# Patient Record
Sex: Female | Born: 1940 | Race: White | Hispanic: No | Smoking: Former smoker
Health system: Southern US, Community
[De-identification: ages and names within clinical notes are randomized; demographics above are authoritative.]

## PROBLEM LIST (undated history)

## (undated) DIAGNOSIS — E785 Hyperlipidemia, unspecified: Secondary | ICD-10-CM

## (undated) DIAGNOSIS — F0390 Unspecified dementia without behavioral disturbance: Secondary | ICD-10-CM

## (undated) DIAGNOSIS — C439 Malignant melanoma of skin, unspecified: Secondary | ICD-10-CM

## (undated) DIAGNOSIS — D471 Chronic myeloproliferative disease: Secondary | ICD-10-CM

## (undated) DIAGNOSIS — E871 Hypo-osmolality and hyponatremia: Secondary | ICD-10-CM

## (undated) DIAGNOSIS — L309 Dermatitis, unspecified: Secondary | ICD-10-CM

## (undated) DIAGNOSIS — I10 Essential (primary) hypertension: Secondary | ICD-10-CM

## (undated) DIAGNOSIS — M8580 Other specified disorders of bone density and structure, unspecified site: Secondary | ICD-10-CM

## (undated) HISTORY — DX: Chronic myeloproliferative disease: D47.1

## (undated) HISTORY — DX: Essential (primary) hypertension: I10

## (undated) HISTORY — PX: OVARIAN CYST REMOVAL: SHX89

## (undated) HISTORY — DX: Hypo-osmolality and hyponatremia: E87.1

## (undated) HISTORY — DX: Dermatitis, unspecified: L30.9

## (undated) HISTORY — DX: Malignant melanoma of skin, unspecified: C43.9

## (undated) HISTORY — DX: Hyperlipidemia, unspecified: E78.5

## (undated) HISTORY — PX: COLONOSCOPY: SHX174

## (undated) HISTORY — DX: Unspecified dementia, unspecified severity, without behavioral disturbance, psychotic disturbance, mood disturbance, and anxiety: F03.90

## (undated) HISTORY — DX: Other specified disorders of bone density and structure, unspecified site: M85.80

---

## 1999-12-26 ENCOUNTER — Encounter: Payer: Self-pay | Admitting: Family Medicine

## 2001-01-08 ENCOUNTER — Other Ambulatory Visit: Admission: RE | Admit: 2001-01-08 | Discharge: 2001-01-08 | Payer: Self-pay | Admitting: Family Medicine

## 2001-01-15 ENCOUNTER — Encounter: Payer: Self-pay | Admitting: Family Medicine

## 2001-01-15 ENCOUNTER — Ambulatory Visit (HOSPITAL_COMMUNITY): Admission: RE | Admit: 2001-01-15 | Discharge: 2001-01-15 | Payer: Self-pay | Admitting: Family Medicine

## 2002-01-26 ENCOUNTER — Encounter: Payer: Self-pay | Admitting: Family Medicine

## 2002-01-26 ENCOUNTER — Ambulatory Visit (HOSPITAL_COMMUNITY): Admission: RE | Admit: 2002-01-26 | Discharge: 2002-01-26 | Payer: Self-pay | Admitting: Family Medicine

## 2002-05-03 ENCOUNTER — Other Ambulatory Visit: Admission: RE | Admit: 2002-05-03 | Discharge: 2002-05-03 | Payer: Self-pay | Admitting: Family Medicine

## 2002-05-12 ENCOUNTER — Emergency Department (HOSPITAL_COMMUNITY): Admission: EM | Admit: 2002-05-12 | Discharge: 2002-05-12 | Payer: Self-pay | Admitting: Emergency Medicine

## 2002-05-31 ENCOUNTER — Encounter: Payer: Self-pay | Admitting: Family Medicine

## 2003-01-31 ENCOUNTER — Ambulatory Visit (HOSPITAL_COMMUNITY): Admission: RE | Admit: 2003-01-31 | Discharge: 2003-01-31 | Payer: Self-pay | Admitting: Family Medicine

## 2003-01-31 ENCOUNTER — Encounter: Payer: Self-pay | Admitting: Family Medicine

## 2004-02-01 ENCOUNTER — Ambulatory Visit (HOSPITAL_COMMUNITY): Admission: RE | Admit: 2004-02-01 | Discharge: 2004-02-01 | Payer: Self-pay | Admitting: Family Medicine

## 2004-09-25 ENCOUNTER — Ambulatory Visit: Payer: Self-pay | Admitting: Family Medicine

## 2004-11-25 ENCOUNTER — Ambulatory Visit: Payer: Self-pay | Admitting: Family Medicine

## 2005-02-05 ENCOUNTER — Ambulatory Visit (HOSPITAL_COMMUNITY): Admission: RE | Admit: 2005-02-05 | Discharge: 2005-02-05 | Payer: Self-pay | Admitting: Family Medicine

## 2005-05-26 ENCOUNTER — Ambulatory Visit: Payer: Self-pay | Admitting: Family Medicine

## 2005-06-02 ENCOUNTER — Ambulatory Visit: Payer: Self-pay | Admitting: Family Medicine

## 2005-09-04 ENCOUNTER — Ambulatory Visit: Payer: Self-pay | Admitting: Family Medicine

## 2006-02-06 ENCOUNTER — Ambulatory Visit (HOSPITAL_COMMUNITY): Admission: RE | Admit: 2006-02-06 | Discharge: 2006-02-06 | Payer: Self-pay | Admitting: Family Medicine

## 2006-05-26 ENCOUNTER — Ambulatory Visit: Payer: Self-pay | Admitting: Family Medicine

## 2006-06-02 ENCOUNTER — Ambulatory Visit: Payer: Self-pay | Admitting: Family Medicine

## 2006-09-16 ENCOUNTER — Ambulatory Visit: Payer: Self-pay | Admitting: Family Medicine

## 2006-12-21 ENCOUNTER — Ambulatory Visit: Payer: Self-pay | Admitting: Family Medicine

## 2007-02-08 ENCOUNTER — Ambulatory Visit (HOSPITAL_COMMUNITY): Admission: RE | Admit: 2007-02-08 | Discharge: 2007-02-08 | Payer: Self-pay | Admitting: Family Medicine

## 2007-05-28 DIAGNOSIS — I10 Essential (primary) hypertension: Secondary | ICD-10-CM

## 2007-07-01 ENCOUNTER — Ambulatory Visit: Payer: Self-pay | Admitting: Family Medicine

## 2007-07-01 DIAGNOSIS — E785 Hyperlipidemia, unspecified: Secondary | ICD-10-CM | POA: Insufficient documentation

## 2007-07-01 DIAGNOSIS — Z85828 Personal history of other malignant neoplasm of skin: Secondary | ICD-10-CM

## 2007-08-12 ENCOUNTER — Ambulatory Visit: Payer: Self-pay | Admitting: Family Medicine

## 2008-02-10 ENCOUNTER — Ambulatory Visit (HOSPITAL_COMMUNITY): Admission: RE | Admit: 2008-02-10 | Discharge: 2008-02-10 | Payer: Self-pay | Admitting: Family Medicine

## 2008-06-30 ENCOUNTER — Ambulatory Visit: Payer: Self-pay | Admitting: Family Medicine

## 2008-06-30 DIAGNOSIS — Z8719 Personal history of other diseases of the digestive system: Secondary | ICD-10-CM | POA: Insufficient documentation

## 2008-07-27 ENCOUNTER — Encounter: Payer: Self-pay | Admitting: Family Medicine

## 2008-07-27 HISTORY — PX: MELANOMA EXCISION: SHX5266

## 2008-08-07 ENCOUNTER — Encounter: Payer: Self-pay | Admitting: Family Medicine

## 2008-08-09 ENCOUNTER — Ambulatory Visit: Payer: Self-pay | Admitting: Family Medicine

## 2008-08-14 ENCOUNTER — Ambulatory Visit (HOSPITAL_COMMUNITY): Admission: RE | Admit: 2008-08-14 | Discharge: 2008-08-14 | Payer: Self-pay | Admitting: General Surgery

## 2008-08-15 ENCOUNTER — Ambulatory Visit (HOSPITAL_COMMUNITY): Admission: RE | Admit: 2008-08-15 | Discharge: 2008-08-15 | Payer: Self-pay | Admitting: General Surgery

## 2008-08-15 ENCOUNTER — Encounter (INDEPENDENT_AMBULATORY_CARE_PROVIDER_SITE_OTHER): Payer: Self-pay | Admitting: General Surgery

## 2008-08-22 ENCOUNTER — Encounter: Payer: Self-pay | Admitting: Family Medicine

## 2008-08-28 ENCOUNTER — Encounter: Payer: Self-pay | Admitting: Family Medicine

## 2009-01-03 ENCOUNTER — Encounter: Payer: Self-pay | Admitting: Family Medicine

## 2009-01-03 ENCOUNTER — Ambulatory Visit: Payer: Self-pay | Admitting: Internal Medicine

## 2009-01-03 DIAGNOSIS — M8580 Other specified disorders of bone density and structure, unspecified site: Secondary | ICD-10-CM

## 2009-01-03 HISTORY — DX: Other specified disorders of bone density and structure, unspecified site: M85.80

## 2009-02-12 ENCOUNTER — Ambulatory Visit (HOSPITAL_COMMUNITY): Admission: RE | Admit: 2009-02-12 | Discharge: 2009-02-12 | Payer: Self-pay | Admitting: Family Medicine

## 2009-07-05 IMAGING — CR DG CHEST 2V
2 series · 2 of 2 positions shown · non-contrast
Comparison: None

CLINICAL DATA: Melanoma on the back, preop, smoking history

CHEST - 2 VIEW

[view not recorded (1 of 2)]
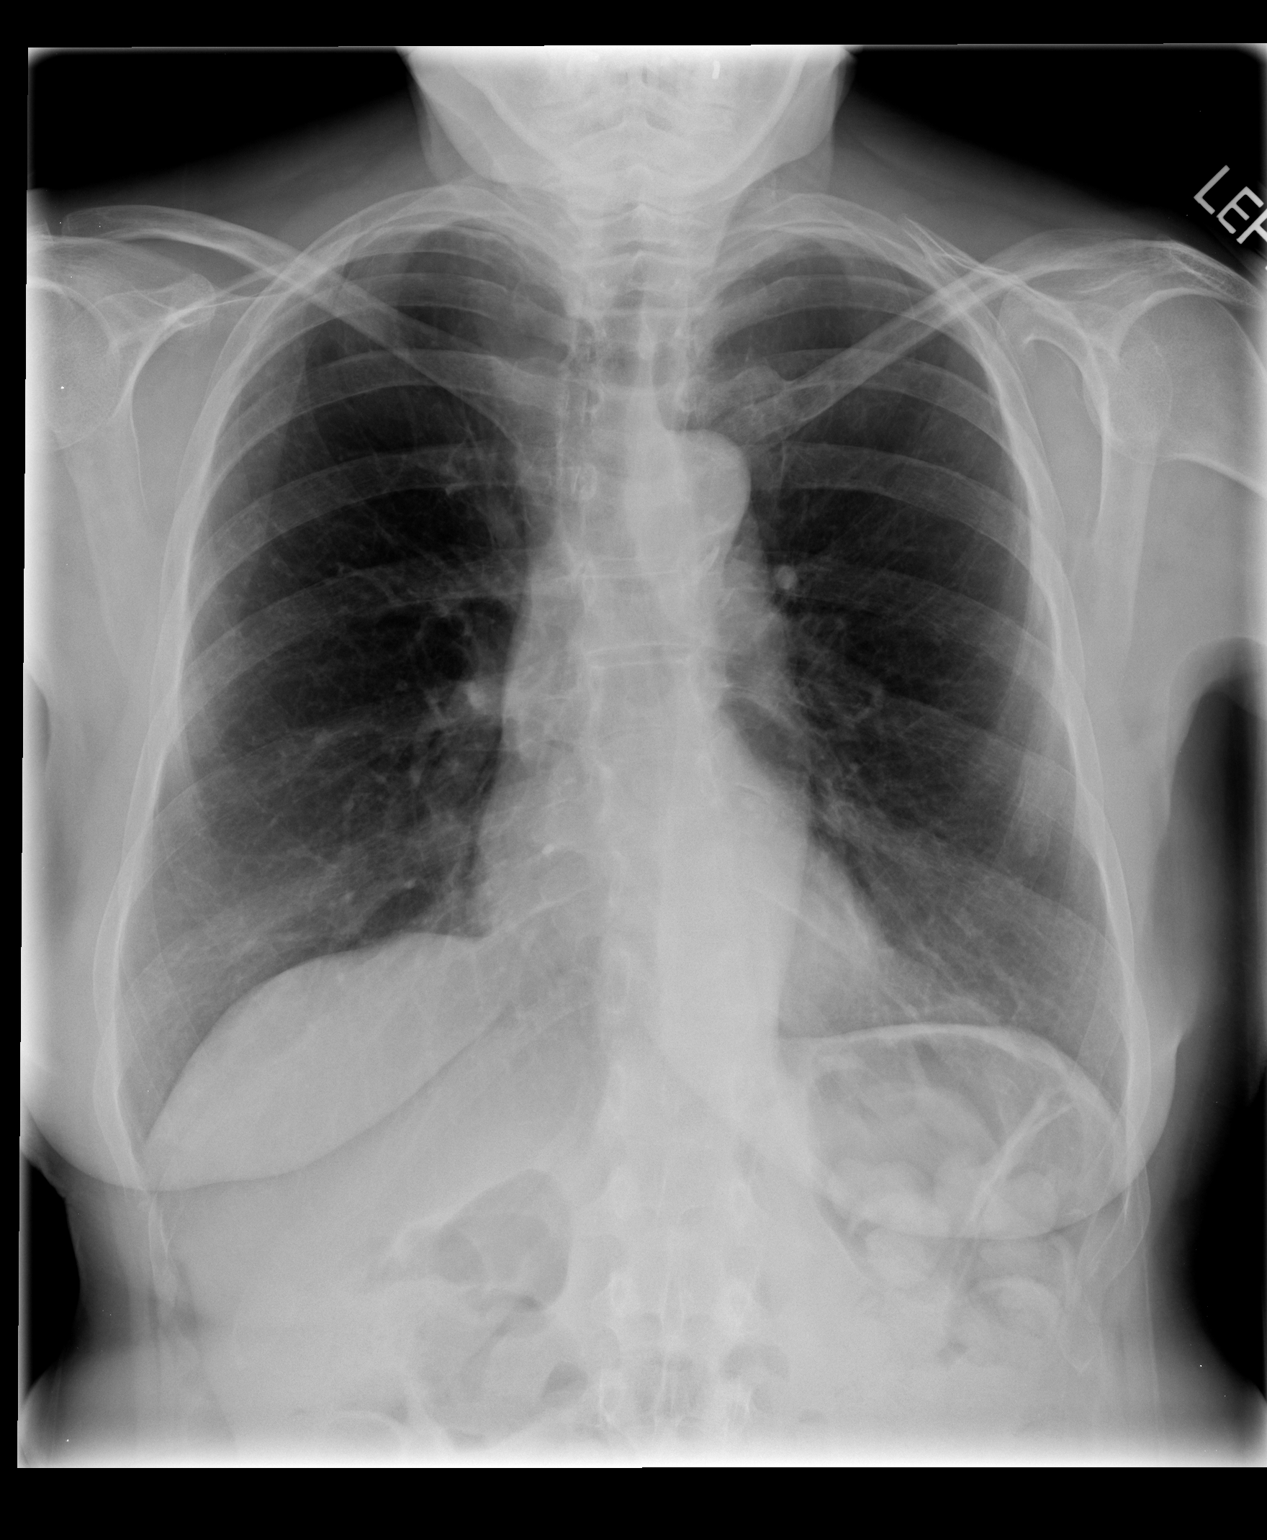

[view not recorded (2 of 2)]
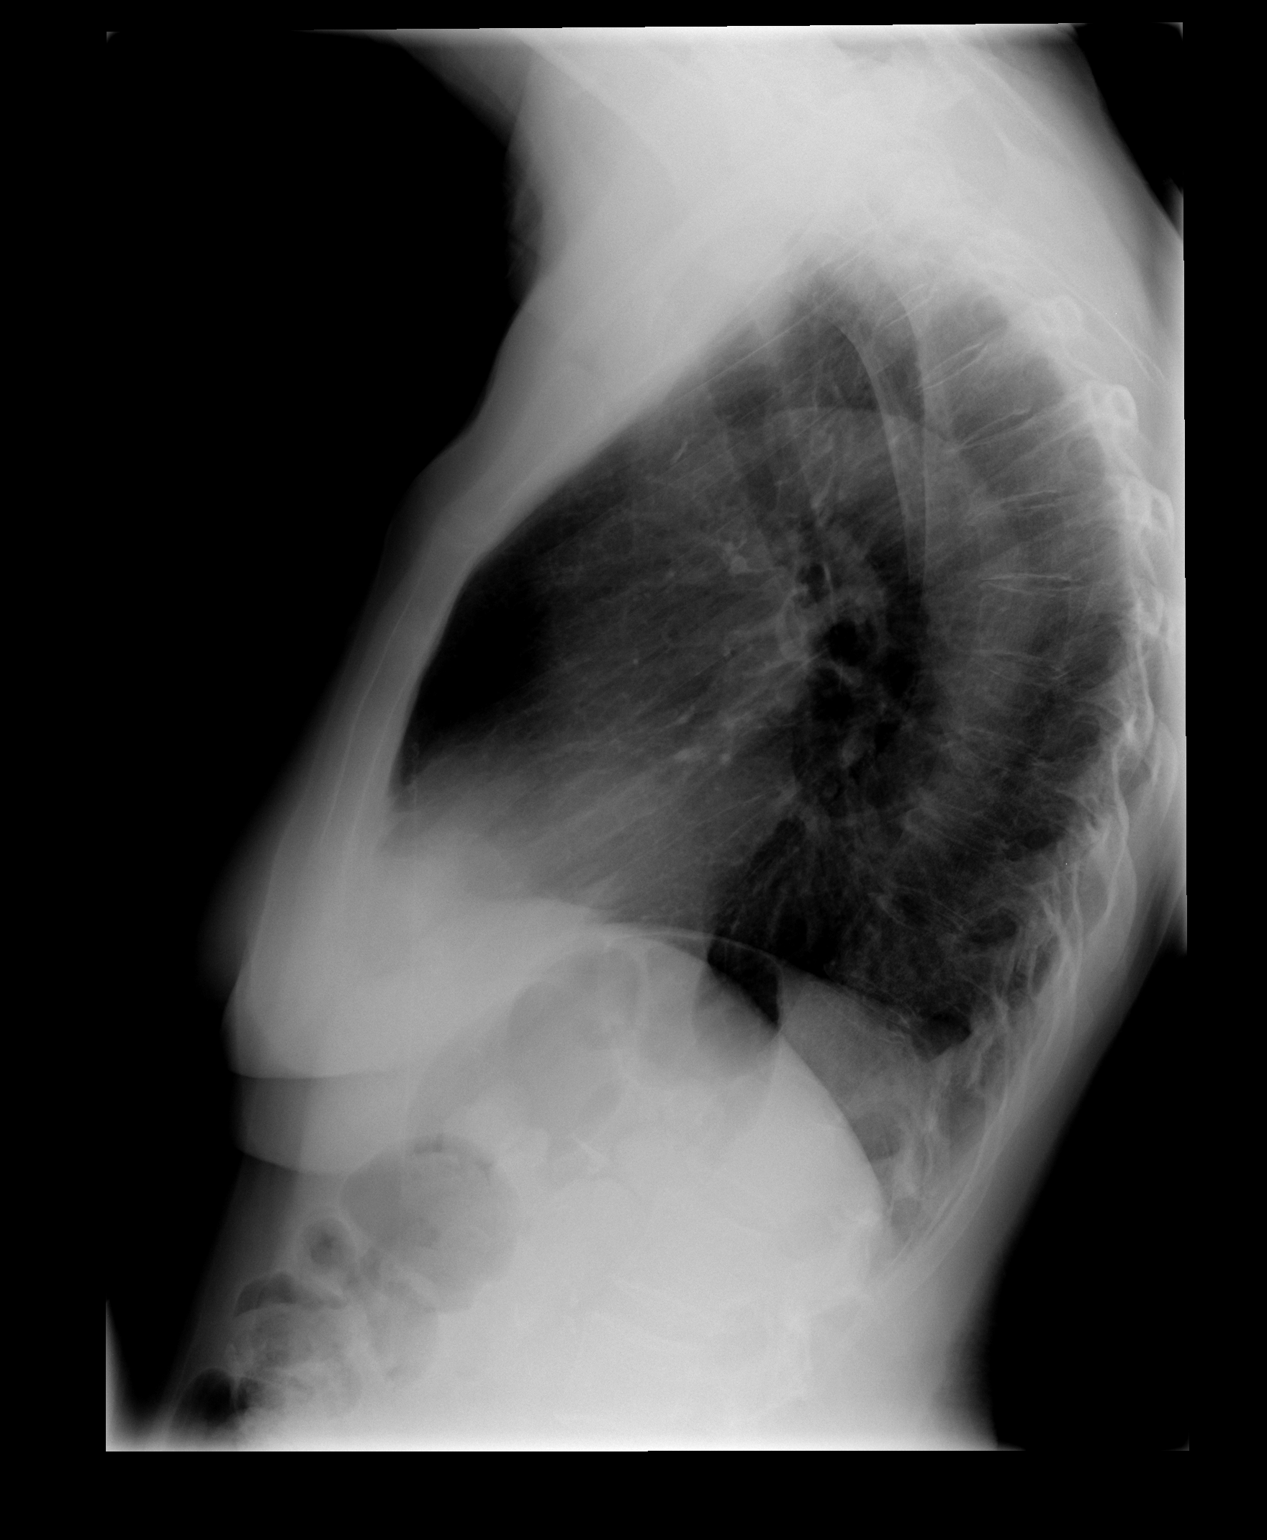

[2 of 2 positions shown; findings below may reference images not displayed]

FINDINGS: No active infiltrate or effusion is seen.  There is a
small nodular opacity at the left lung base overlying the anterior
left fifth rib.  This could represent a small lung nodule or bony
density and comparison with prior or follow-up chest x-ray is
recommended.  The heart is within normal limits in size.  No acute
bony abnormality is seen.
IMPRESSION: No active lung disease.  Small nodule at the left lung base as
noted above.  Compare with prior or follow-up chest x-ray versus CT
of the chest.

## 2009-07-17 ENCOUNTER — Ambulatory Visit: Payer: Self-pay | Admitting: Family Medicine

## 2009-07-17 DIAGNOSIS — M899 Disorder of bone, unspecified: Secondary | ICD-10-CM | POA: Insufficient documentation

## 2009-07-17 DIAGNOSIS — M949 Disorder of cartilage, unspecified: Secondary | ICD-10-CM

## 2009-08-17 ENCOUNTER — Ambulatory Visit: Payer: Self-pay | Admitting: Family Medicine

## 2009-08-17 DIAGNOSIS — E871 Hypo-osmolality and hyponatremia: Secondary | ICD-10-CM

## 2009-08-20 LAB — CONVERTED CEMR LAB
BUN: 11 mg/dL (ref 6–23)
Creatinine, Ser: 0.7 mg/dL (ref 0.4–1.2)
GFR calc non Af Amer: 88.28 mL/min (ref >60)

## 2010-02-12 LAB — HM MAMMOGRAPHY

## 2010-02-13 ENCOUNTER — Ambulatory Visit (HOSPITAL_COMMUNITY): Admission: RE | Admit: 2010-02-13 | Discharge: 2010-02-13 | Payer: Self-pay | Admitting: Family Medicine

## 2010-04-03 ENCOUNTER — Ambulatory Visit: Payer: Self-pay | Admitting: Internal Medicine

## 2010-04-03 DIAGNOSIS — M79609 Pain in unspecified limb: Secondary | ICD-10-CM | POA: Insufficient documentation

## 2010-09-17 ENCOUNTER — Ambulatory Visit: Payer: Self-pay | Admitting: Family Medicine

## 2010-09-17 ENCOUNTER — Encounter: Payer: Self-pay | Admitting: Family Medicine

## 2010-09-17 DIAGNOSIS — G589 Mononeuropathy, unspecified: Secondary | ICD-10-CM | POA: Insufficient documentation

## 2010-09-18 LAB — CONVERTED CEMR LAB
Albumin: 4.4 g/dL (ref 3.5–5.2)
BUN: 18 mg/dL (ref 6–23)
Basophils Relative: 0.8 % (ref 0.0–3.0)
Bilirubin, Direct: 0.1 mg/dL (ref 0.0–0.3)
CO2: 30 meq/L (ref 19–32)
Chloride: 93 meq/L — ABNORMAL LOW (ref 96–112)
Cholesterol: 201 mg/dL — ABNORMAL HIGH (ref 0–200)
Creatinine, Ser: 0.8 mg/dL (ref 0.4–1.2)
Direct LDL: 127.2 mg/dL
Eosinophils Absolute: 0.2 10*3/uL (ref 0.0–0.7)
HCT: 40.4 % (ref 36.0–46.0)
Lymphs Abs: 2.6 10*3/uL (ref 0.7–4.0)
MCHC: 34.1 g/dL (ref 30.0–36.0)
MCV: 93.8 fL (ref 78.0–100.0)
Monocytes Absolute: 0.6 10*3/uL (ref 0.1–1.0)
Neutrophils Relative %: 72.2 % (ref 43.0–77.0)
RBC: 4.31 M/uL (ref 3.87–5.11)
TSH: 0.92 microintl units/mL (ref 0.35–5.50)
Total CHOL/HDL Ratio: 4
Total Protein: 7.4 g/dL (ref 6.0–8.3)
Vitamin B-12: 488 pg/mL (ref 211–911)

## 2010-10-03 ENCOUNTER — Ambulatory Visit: Payer: Self-pay | Admitting: Family Medicine

## 2010-10-07 LAB — CONVERTED CEMR LAB
BUN: 13 mg/dL (ref 6–23)
Bilirubin Urine: NEGATIVE
Blood, UA: NEGATIVE
Calcium: 9.8 mg/dL (ref 8.4–10.5)
Creatinine, Ser: 0.6 mg/dL (ref 0.4–1.2)
GFR calc non Af Amer: 99.36 mL/min (ref >60.00)
Nitrite: NEGATIVE
Urobilinogen, UA: 0.2 (ref 0.0–1.0)

## 2010-11-17 ENCOUNTER — Encounter: Payer: Self-pay | Admitting: Family Medicine

## 2010-11-24 LAB — CONVERTED CEMR LAB
ALT: 14 units/L (ref 0–35)
ALT: 14 units/L (ref 0–35)
AST: 22 units/L (ref 0–37)
AST: 22 units/L (ref 0–37)
Alkaline Phosphatase: 47 units/L (ref 39–117)
Alkaline Phosphatase: 50 units/L (ref 39–117)
BUN: 5 mg/dL — ABNORMAL LOW (ref 6–23)
BUN: 7 mg/dL (ref 6–23)
Basophils Absolute: 0 10*3/uL (ref 0.0–0.1)
Basophils Absolute: 0 10*3/uL (ref 0.0–0.1)
Basophils Relative: 0.1 % (ref 0.0–3.0)
Basophils Relative: 0.5 % (ref 0.0–1.0)
Bilirubin, Direct: 0.1 mg/dL (ref 0.0–0.3)
Blood in Urine, dipstick: NEGATIVE
CO2: 31 meq/L (ref 19–32)
CO2: 31 meq/L (ref 19–32)
Calcium: 9.6 mg/dL (ref 8.4–10.5)
Chloride: 96 meq/L (ref 96–112)
Cholesterol: 211 mg/dL (ref 0–200)
Creatinine, Ser: 0.6 mg/dL (ref 0.4–1.2)
Creatinine, Ser: 0.6 mg/dL (ref 0.4–1.2)
Direct LDL: 144.6 mg/dL
Eosinophils Relative: 1.3 % (ref 0.0–5.0)
Eosinophils Relative: 2 % (ref 0.0–5.0)
GFR calc non Af Amer: 130.2 mL/min (ref >60)
Glucose, Bld: 109 mg/dL — ABNORMAL HIGH (ref 70–99)
Glucose, Bld: 89 mg/dL (ref 70–99)
Glucose, Bld: 97 mg/dL (ref 70–99)
HCT: 41.2 % (ref 36.0–46.0)
HCT: 44.1 % (ref 36.0–46.0)
HDL: 35.7 mg/dL — ABNORMAL LOW (ref >39.0)
HDL: 48.5 mg/dL (ref >39.00)
Hemoglobin: 14.2 g/dL (ref 12.0–15.0)
Lymphocytes Relative: 15.1 % (ref 12.0–46.0)
Lymphocytes Relative: 32.5 % (ref 12.0–46.0)
Lymphs Abs: 2.3 10*3/uL (ref 0.7–4.0)
MCHC: 33.3 g/dL (ref 30.0–36.0)
MCV: 93.9 fL (ref 78.0–100.0)
Monocytes Absolute: 0.7 10*3/uL (ref 0.2–0.7)
Monocytes Absolute: 0.8 10*3/uL (ref 0.1–1.0)
Monocytes Relative: 6.2 % (ref 3.0–12.0)
Monocytes Relative: 7.8 % (ref 3.0–11.0)
Neutro Abs: 5.2 10*3/uL (ref 1.4–7.7)
Neutrophils Relative %: 77.3 % — ABNORMAL HIGH (ref 43.0–77.0)
Nitrite: NEGATIVE
Platelets: 771 10*3/uL — ABNORMAL HIGH (ref 150.0–400.0)
Potassium: 3.6 meq/L (ref 3.5–5.1)
Potassium: 4 meq/L (ref 3.5–5.1)
RBC: 4.54 M/uL (ref 3.87–5.11)
RDW: 12.3 % (ref 11.5–14.6)
RDW: 12.7 % (ref 11.5–14.6)
Sodium: 130 meq/L — ABNORMAL LOW (ref 135–145)
TSH: 0.71 microintl units/mL (ref 0.35–5.50)
Total Bilirubin: 0.7 mg/dL (ref 0.3–1.2)
Total Bilirubin: 0.8 mg/dL (ref 0.3–1.2)
Total Bilirubin: 0.8 mg/dL (ref 0.3–1.2)
Total CHOL/HDL Ratio: 4.3
Total CHOL/HDL Ratio: 5.9
Total Protein: 7.2 g/dL (ref 6.0–8.3)
VLDL: 30 mg/dL (ref 0–40)
VLDL: 39 mg/dL (ref 0–40)
WBC: 11.7 10*3/uL — ABNORMAL HIGH (ref 4.5–10.5)
WBC: 9.1 10*3/uL (ref 4.5–10.5)

## 2010-11-26 NOTE — Assessment & Plan Note (Signed)
Summary: foot pain/dm   Vital Signs:  Patient profile:   70 year old female Weight:      122 pounds Temp:     98.1 degrees F oral Pulse rate:   66 / minute BP sitting:   140 / 100  (right arm) Cuff size:   regular  Vitals Entered By: Romualdo Bolk, CMA (AAMA) (April 03, 2010 1:52 PM) CC: Left foot pain  that started on 5/29.   History of Present Illness: Donna Webb comes in today  for SDA for above problem with her sister.  She was in her usuual  state of health until about 10 days when she had the insidious onset of left lateral foot and heel pain.    noted some bruising also and stopped [facility] to do healthy lifestyle .   no Meds no fall and no pops. No hx of fractures but has " thinning bones"    NO easy bleeding except skinwhen on asa  so stopped this. No previous foot problem.  Preventive Screening-Counseling & Management  Alcohol-Tobacco     Alcohol drinks/day: 3     Alcohol type: wine     Smoking Status: current     Packs/Day: <0.25  Caffeine-Diet-Exercise     Caffeine use/day: 1      Does Patient Exercise: yes  Current Medications (verified): 1)  Atenolol 50 Mg Tabs (Atenolol) .... Two Times A Day 2)  Clonidine Hcl 0.2 Mg  Tabs (Clonidine Hcl) .... Two Times A Day 3)  Sm Calcium/vitamin D 500-200 Mg-Unit Tabs (Calcium Carbonate-Vitamin D) .... Take 1 Tablet By Mouth Two Times A Day 4)  Multivitamins   Caps (Multiple Vitamin) .... Once Daily 5)  Betamethasone Dipropionate 0.05 %  Crea (Betamethasone Dipropionate) .... Two Times A Day As Needed For Eczema 6)  Lisinopril 20 Mg Tabs (Lisinopril) .... Once Daily  Allergies (verified): 1)  ! Norvasc (Amlodipine Besylate)  Past History:  Past medical, surgical, family and social histories (including risk factors) reviewed for relevance to current acute and chronic problems.  Past Medical History: Reviewed history from 08/17/2009 and no changes  required. Hypertension Hyperlipidemia eczema melanoma on back. Sees Dr. Terri Piedra every 6 months Osteopenia per DEXA 01-03-09 hyponatremia  Past Surgical History: Reviewed history from 07/17/2009 and no changes required. removal of ovarian cyst she refuses colonoscopies excision of melanoma from the back 07-27-08 per Dr. Emelia Loron  Family History: Reviewed history from 07/17/2009 and no changes required. Family History of CAD Female 1st degree relative <50 Alzheimer's disease                                                                               Parkinson's disease Family History of Anemia/FE deficiency  Social History: Reviewed history from 07/17/2009 and no changes required. Retired Current Smoker Alcohol use-yes Regular exercise-yes Widow/Widower Packs/Day:  <0.25 Caffeine use/day:  1   Review of Systems  The patient denies anorexia, fever, chest pain, syncope, abdominal pain, muscle weakness, abnormal bleeding, and angioedema.         denies new cv pulm symptoms .   Physical Exam  General:  elderly  alert  in nad  Head:  normocephalic and atraumatic.   Msk:  left foot with  tenderness and bruising  lateral distal foot near 5th metatarsal small amt on heel  .  no ulcers  and no joint swelling   Pulses:  present bilaterally in foot   nl temperature and cap refill  Skin:  turgor normal and color normal.  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     Impression & Recommendations:  Problem # 1:  FOOT PAIN, LEFT (ICD-729.5) Assessment New r/o fracture and or stress fx  / has been doing a good deal of weight bearing exercise  . although insidious onset and skin bruising noted .  doesnt appear to be circulatrory at present  pulse is present . and foot temperature nl. Orders: T-Foot Left Min 3 Views (73630TC)  Problem # 2:   HYPERTENSION (ICD-401.9) up some today  Her updated medication list for this problem includes:    Atenolol 50 Mg Tabs (Atenolol) .Marland Kitchen..Marland Kitchen Two times a day    Clonidine Hcl 0.2 Mg Tabs (Clonidine hcl) .Marland Kitchen..Marland Kitchen Two times a day    Lisinopril 20 Mg Tabs (Lisinopril) ..... Once daily  Problem # 3:  OSTEOPENIA (ICD-733.90)  Her updated medication list for this problem includes:    Sm Calcium/vitamin D 500-200 Mg-unit Tabs (Calcium carbonate-vitamin d) .Marland Kitchen... Take 1 tablet by mouth two times a day  Complete Medication List: 1)  Atenolol 50 Mg Tabs (Atenolol) .... Two times a day 2)  Clonidine Hcl 0.2 Mg Tabs (Clonidine hcl) .... Two times a day 3)  Sm Calcium/vitamin D 500-200 Mg-unit Tabs (Calcium carbonate-vitamin d) .... Take 1 tablet by mouth two times a day 4)  Multivitamins Caps (Multiple vitamin) .... Once daily 5)  Betamethasone Dipropionate 0.05 % Crea (Betamethasone dipropionate) .... Two times a day as needed for eczema 6)  Lisinopril 20 Mg Tabs (Lisinopril) .... Once daily  Patient Instructions: 1)  get  x ray  and will notify you of results and plan   2)  tylenol ok for pain.  Prevention & Chronic Care Immunizations   Influenza vaccine: Fluvax 3+  (08/09/2008)    Tetanus booster: Not documented    Pneumococcal vaccine: Not documented    H. zoster vaccine: Not documented  Colorectal Screening   Hemoccult: Not documented    Colonoscopy: Not documented  Other Screening   Pap smear: Not documented    Mammogram: ASSESSMENT: Negative - BI-RADS 1^MM DIGITAL SCREENING  (02/13/2010)    DXA bone density scan: Not documented   Smoking status: current  (04/03/2010)  Lipids   Total Cholesterol: 215  (07/17/2009)   LDL: DEL  (06/30/2008)   LDL Direct: 156.9  (07/17/2009)   HDL: 48.50  (07/17/2009)   Triglycerides: 161.0  (07/17/2009)    SGOT (AST): 22  (07/17/2009)   SGPT (ALT): 14  (07/17/2009)   Alkaline phosphatase: 50  (07/17/2009)   Total bilirubin: 0.8   (07/17/2009)  Hypertension   Last Blood Pressure: 140 / 100  (04/03/2010)   Serum creatinine: 0.7  (08/17/2009)   Serum potassium 5.1  (08/17/2009)  Self-Management Support :    Hypertension self-management support: Not documented    Lipid self-management support: Not documented

## 2010-11-26 NOTE — Assessment & Plan Note (Signed)
Summary: emp===will fast//ccm   Vital Signs:  Patient profile:   70 year old female Height:      60 inches Weight:      123 pounds BMI:     24.11 O2 Sat:      94 % Temp:     98.2 degrees F Pulse rate:   87 / minute BP sitting:   140 / 86  (left arm) Cuff size:   regular  Vitals Entered By: Pura Spice, RN (September 17, 2010 8:48 AM) CC: cpx  fastng  On ABT for abscess.    History of Present Illness: 70 yr old female for a cpx. She is doing well from a general health perspective. She is currently on antibiotics for an abcessed tooth. She remains active and works out at [employer] several days a week.   Allergies: 1)  ! Norvasc (Amlodipine Besylate)  Comments:  Provider: The patient's medications were reviewed with the patient and were updated in the Medication List.  Past History:  Past Medical History: Reviewed history from 08/17/2009 and no changes required. Hypertension Hyperlipidemia eczema melanoma on back. Sees Dr. Terri Piedra every 6 months Osteopenia per DEXA 01-03-09 hyponatremia  Past Surgical History: Reviewed history from 07/17/2009 and no changes required. removal of ovarian cyst she refuses colonoscopies excision of melanoma from the back 07-27-08 per Dr. Emelia Loron  Family History: Reviewed history from 07/17/2009 and no changes required. Family History of CAD Female 1st degree relative <50 Alzheimer's disease                                                                               Parkinson's disease Family History of Anemia/FE deficiency  Social History: Reviewed history from 07/17/2009 and no changes required. Retired Current Smoker Alcohol use-yes Regular exercise-yes Widow/Widower  Review of Systems  The patient denies anorexia, fever, weight loss, weight gain, vision loss, decreased hearing, hoarseness, chest pain, syncope, dyspnea on exertion, peripheral edema, prolonged cough, headaches, hemoptysis, abdominal pain, melena,  hematochezia, severe indigestion/heartburn, hematuria, incontinence, genital sores, muscle weakness, suspicious skin lesions, transient blindness, difficulty walking, depression, unusual weight change, abnormal bleeding, enlarged lymph nodes, angioedema, breast masses, and testicular masses.    Physical Exam  General:  Well-developed,well-nourished,in no acute distress; alert,appropriate and cooperative throughout examination Head:  Normocephalic and atraumatic without obvious abnormalities. No apparent alopecia or balding. Eyes:  No corneal or conjunctival inflammation noted. EOMI. Perrla. Funduscopic exam benign, without hemorrhages, exudates or papilledema. Vision grossly normal. Ears:  External ear exam shows no significant lesions or deformities.  Otoscopic examination reveals clear canals, tympanic membranes are intact bilaterally without bulging, retraction, inflammation or discharge. Hearing is grossly normal bilaterally. Nose:  External nasal examination shows no deformity or inflammation. Nasal mucosa are pink and moist without lesions or exudates. Mouth:  Oral mucosa and oropharynx without lesions or exudates.  Teeth in good repair. Neck:  No deformities, masses, or tenderness noted. Chest Wall:  No deformities, masses, or tenderness noted. Breasts:  No mass, nodules, thickening, tenderness, bulging, retraction, inflamation, nipple discharge or skin changes noted.   Lungs:  Normal respiratory effort, chest expands symmetrically. Lungs are clear to auscultation, no crackles or wheezes. Heart:  Normal  rate and regular rhythm. S1 and S2 normal without gallop, murmur, click, rub or other extra sounds. EKG normal  Abdomen:  Bowel sounds positive,abdomen soft and non-tender without masses, organomegaly or hernias noted. Msk:  No deformity or scoliosis noted of thoracic or lumbar spine.   Pulses:  R and L carotid,radial,femoral,dorsalis pedis and posterior tibial pulses are full and equal  bilaterally Extremities:  No clubbing, cyanosis, edema, or deformity noted with normal full range of motion of all joints.   Neurologic:  No cranial nerve deficits noted. Station and gait are normal. Plantar reflexes are down-going bilaterally. DTRs are symmetrical throughout. Sensory, motor and coordinative functions appear intact. Skin:  Intact without suspicious lesions or rashes Cervical Nodes:  No lymphadenopathy noted Axillary Nodes:  No palpable lymphadenopathy Inguinal Nodes:  No significant adenopathy Psych:  Cognition and judgment appear intact. Alert and cooperative with normal attention span and concentration. No apparent delusions, illusions, hallucinations   Impression & Recommendations:  Problem # 1:  HYPONATREMIA (ICD-276.1)  Problem # 2:  OSTEOPENIA (ICD-733.90)  Her updated medication list for this problem includes:    Sm Calcium/vitamin D 500-200 Mg-unit Tabs (Calcium carbonate-vitamin d) .Marland Kitchen... Take 1 tablet by mouth two times a day  Problem # 3:  HYPERLIPIDEMIA (ICD-272.4)  Problem # 4:  HYPERTENSION (ICD-401.9)  Her updated medication list for this problem includes:    Atenolol 50 Mg Tabs (Atenolol) .Marland Kitchen..Marland Kitchen Two times a day    Clonidine Hcl 0.2 Mg Tabs (Clonidine hcl) .Marland Kitchen..Marland Kitchen Two times a day    Lisinopril 20 Mg Tabs (Lisinopril) ..... Once daily  Orders: UA Dipstick w/o Micro (automated)  (81003) EKG w/ Interpretation (93000) Venipuncture (38250) TLB-Lipid Panel (80061-LIPID) TLB-BMP (Basic Metabolic Panel-BMET) (80048-METABOL) TLB-CBC Platelet - w/Differential (85025-CBCD) TLB-Hepatic/Liver Function Pnl (80076-HEPATIC) TLB-TSH (Thyroid Stimulating Hormone) (84443-TSH)  Complete Medication List: 1)  Atenolol 50 Mg Tabs (Atenolol) .... Two times a day 2)  Clonidine Hcl 0.2 Mg Tabs (Clonidine hcl) .... Two times a day 3)  Sm Calcium/vitamin D 500-200 Mg-unit Tabs (Calcium carbonate-vitamin d) .... Take 1 tablet by mouth two times a day 4)  Multivitamins Caps  (Multiple vitamin) .... Once daily 5)  Betamethasone Dipropionate 0.05 % Crea (Betamethasone dipropionate) .... Two times a day as needed for eczema 6)  Lisinopril 20 Mg Tabs (Lisinopril) .... Once daily 7)  Clindamycin Hcl 150 Mg Caps (Clindamycin hcl) 8)  Amoxicillin 875 Mg Tabs (Amoxicillin)  Other Orders: TLB-B12, Serum-Total ONLY (53976-B34)  Patient Instructions: 1)  Get fasting labs today Prescriptions: LISINOPRIL 20 MG TABS (LISINOPRIL) once daily  #90 x 3   Entered and Authorized by:   [name] MD   Signed by:   [name] MD on 09/17/2010   Method used:   Print then Give to Patient   RxID:   [id number] CLONIDINE HCL 0.2 MG  TABS (CLONIDINE HCL) two times a day  #180 x 3   Entered and Authorized by:   [name] MD   Signed by:   [name] MD on 09/17/2010   Method used:   Print then Give to Patient   RxID:   [id number] ATENOLOL 50 MG TABS (ATENOLOL) two times a day  #180 x 3   Entered and Authorized by:   [name] MD   Signed by:   [name] MD on 09/17/2010   Method used:   Print then Give to Patient   RxID:   [id number]    Orders Added: 1)  Est. Patient Level IV [41324] 2)  UA Dipstick w/o Micro (automated)  [81003] 3)  EKG w/ Interpretation [93000] 4)  Venipuncture [36415] 5)  TLB-Lipid Panel [80061-LIPID] 6)  TLB-BMP (Basic Metabolic Panel-BMET) [80048-METABOL] 7)  TLB-CBC Platelet - w/Differential [85025-CBCD] 8)  TLB-Hepatic/Liver Function Pnl [80076-HEPATIC] 9)  TLB-TSH (Thyroid Stimulating Hormone) [84443-TSH] 10)  TLB-B12, Serum-Total ONLY [82607-B12]  Appended Document: Orders Update    Clinical Lists Changes  Observations: Added new observation of COMMENTS: Wynona Canes, CMA  September 17, 2010 1:33 PM  (09/17/2010 13:33) Added new observation of PH URINE: 7.0  (09/17/2010 13:33) Added new observation of SPEC GR URIN: 1.010  (09/17/2010 13:33) Added new observation of APPEARANCE U: Clear  (09/17/2010  13:33) Added new observation of UA COLOR: yellow  (09/17/2010 13:33) Added new observation of WBC DIPSTK U: trace  (09/17/2010 13:33) Added new observation of NITRITE URN: negative  (09/17/2010 13:33) Added new observation of UROBILINOGEN: 0.2  (09/17/2010 13:33) Added new observation of PROTEIN, URN: negative  (09/17/2010 13:33) Added new observation of BLOOD UR DIP: negative  (09/17/2010 13:33) Added new observation of KETONES URN: negative  (09/17/2010 13:33) Added new observation of BILIRUBIN UR: negative  (09/17/2010 13:33) Added new observation of GLUCOSE, URN: negative  (09/17/2010 13:33)      Laboratory Results   Urine Tests  Date/Time Recieved: September 17, 2010 1:33 PM  Date/Time Reported: September 17, 2010 1:33 PM   Routine Urinalysis   Color: yellow Appearance: Clear Glucose: negative   (Normal Range: Negative) Bilirubin: negative   (Normal Range: Negative) Ketone: negative   (Normal Range: Negative) Spec. Gravity: 1.010   (Normal Range: 1.003-1.035) Blood: negative   (Normal Range: Negative) pH: 7.0   (Normal Range: 5.0-8.0) Protein: negative   (Normal Range: Negative) Urobilinogen: 0.2   (Normal Range: 0-1) Nitrite: negative   (Normal Range: Negative) Leukocyte Esterace: trace   (Normal Range: Negative)    Comments: Wynona Canes, CMA  September 17, 2010 1:33 PM

## 2010-12-09 ENCOUNTER — Encounter: Payer: Self-pay | Admitting: Family Medicine

## 2011-01-08 ENCOUNTER — Ambulatory Visit (INDEPENDENT_AMBULATORY_CARE_PROVIDER_SITE_OTHER): Payer: Medicare Other | Admitting: Family Medicine

## 2011-01-08 ENCOUNTER — Encounter: Payer: Self-pay | Admitting: Family Medicine

## 2011-01-08 VITALS — BP 130/80 | HR 79 | Temp 98.2°F | Wt 121.0 lb

## 2011-01-08 DIAGNOSIS — E871 Hypo-osmolality and hyponatremia: Secondary | ICD-10-CM

## 2011-01-08 DIAGNOSIS — E875 Hyperkalemia: Secondary | ICD-10-CM

## 2011-01-08 DIAGNOSIS — I10 Essential (primary) hypertension: Secondary | ICD-10-CM

## 2011-01-08 LAB — BASIC METABOLIC PANEL
BUN: 16 mg/dL (ref 6–23)
Calcium: 10.9 mg/dL — ABNORMAL HIGH (ref 8.4–10.5)
Creatinine, Ser: 0.8 mg/dL (ref 0.4–1.2)

## 2011-01-08 LAB — CORTISOL: Cortisol, Plasma: 8.7 ug/dL

## 2011-01-08 MED ORDER — BETAMETHASONE DIPROPIONATE 0.05 % EX CREA: TOPICAL_CREAM | Freq: Two times a day (BID) | CUTANEOUS | Status: DC

## 2011-01-08 NOTE — Progress Notes (Signed)
  Subjective:    Patient ID: Donna Webb, female    DOB: 04/09/1941, 70 y.o.   MRN: [id number]  HPI Here to follow up mild hyponatremia and hyperkalemia found during a routine cpx last November. Her first sodium was 131 with a repeat at 130 in December. Her first potassium was 5.5 with a repeat at 5.4 in December. Her BUN and creatinine have been normal. She has been active as usual and feels fine.    Review of Systems  Constitutional: Negative.   Respiratory: Negative.   Cardiovascular: Negative.        Objective:   Physical Exam  Constitutional: She appears well-developed and well-nourished.  Cardiovascular: Normal rate, regular rhythm, normal heart sounds and intact distal pulses.   Pulmonary/Chest: Effort normal and breath sounds normal.          Assessment & Plan:  She probably has mild Addison's Disease. Recheck labs today

## 2011-01-09 ENCOUNTER — Telehealth: Payer: Self-pay

## 2011-01-09 DIAGNOSIS — E2740 Unspecified adrenocortical insufficiency: Secondary | ICD-10-CM

## 2011-01-09 NOTE — Telephone Encounter (Signed)
Pt aware.

## 2011-01-09 NOTE — Telephone Encounter (Signed)
Message copied by Madison Hickman on Thu Jan 09, 2011  9:18 AM ------      Message from: Dwaine Deter      Created: Thu Jan 09, 2011  5:42 AM       She still has low sodium and high potassium, and these have worsened slightly. Refer to Endocrine for possible adrenal insufficiency

## 2011-01-13 ENCOUNTER — Other Ambulatory Visit: Payer: Self-pay | Admitting: Family Medicine

## 2011-01-13 DIAGNOSIS — Z1231 Encounter for screening mammogram for malignant neoplasm of breast: Secondary | ICD-10-CM

## 2011-01-20 NOTE — Progress Notes (Signed)
  Subjective:    Patient ID: Donna Webb, female    DOB: August 11, 1941, 70 y.o.   MRN: [id number]  HPI    Review of Systems     Objective:   Physical Exam        Assessment & Plan:  She probably has Addisons Disease. We will draw labs today to recheck her sodium and potassium levels. If these are still abnormal, we will refer her to Endocrinology.

## 2011-02-17 ENCOUNTER — Ambulatory Visit (HOSPITAL_COMMUNITY)
Admission: RE | Admit: 2011-02-17 | Discharge: 2011-02-17 | Disposition: A | Discharge status: Home / Self Care | Payer: Medicare Other | Source: Ambulatory Visit | Attending: Family Medicine | Admitting: Family Medicine

## 2011-02-17 DIAGNOSIS — Z1231 Encounter for screening mammogram for malignant neoplasm of breast: Secondary | ICD-10-CM | POA: Insufficient documentation

## 2011-03-11 NOTE — Op Note (Signed)
Donna Webb, Donna Webb              ACCOUNT NO.:  [id number]   MEDICAL RECORD NO.:  [id number]          PATIENT TYPE:  AMB   LOCATION:  SDS                          FACILITY:  [facility]   PHYSICIAN:  Juanetta Gosling, MDDATE OF BIRTH:  06-29-1941   DATE OF PROCEDURE:  08/15/2008  DATE OF DISCHARGE:  08/15/2008                               OPERATIVE REPORT   PREOPERATIVE DIAGNOSIS:  Thin back melanoma.   POSTOPERATIVE DIAGNOSIS:  Thin back melanoma.   PROCEDURE:  Wide local excision with 1-cm margins of back melanoma.   SURGEON:  Troy Sine. Dwain Sarna, MD   ASSISTANT:  None.   ANESTHESIA:  General.   SPECIMENS:  Back melanoma with margins,oriented to Pathology.   ESTIMATED BLOOD LOSS:  Minimal.   COMPLICATIONS:  None.   DRAINS:  None.   DISPOSITION:  To PACU in stable condition.   INDICATIONS:  Ms. Diffee is a 71 year old female who was seen by Dr.  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  No ulceration.  No vascular invasion.  Margins were close peripherally.  The deep margin was free making this a T1a lesion.  She was sent to me  for evaluation for wide local excision which we have planned in the  operating room today.   PROCEDURE:  After informed consent was obtained, the patient was first  marked appropriately.  She was then administered 1 g of intravenous  Ancef.  She then underwent general endotracheal anesthesia.  She had  sequential compression devices placed during the operation.  She was  then rolled into prone position and appropriately padded.  Her back was  then prepped and draped in standard sterile surgical fashion.  The  surgical time-out was then performed.  The melanoma was then marked for  a 1-cm margins surrounding it.  This gave approximately a 9-cm length of  an incision.  An incision was then made in an elliptical fashion.  Dissection was  carried out down to the level of her fascia.  This was  then removed and passed off table as a specimen with a single stitch  mark in the superior margin.  Hemostasis was observed.  Flaps were  created for a short distance in either direction.  The dermis and  subcutaneous tissues were brought together with 3-0 Vicryl sutures.  The  skin was closed with a 2-0 nylon in a  vertical mattress fashion.  This closed easily without any tension.  A  10 mL of 0.25% Marcaine was then infiltrated upon completion of this.  Bacitracin was then placed over the wound.  A sterile dressing was  placed.  She was then rolled supine, extubated in the operating room,  and transferred to the PACU in stable condition.      Juanetta Gosling, MD  Electronically Signed     MCW/MEDQ  D:  08/15/2008  T:  08/15/2008  Job:  [id number]   cc:   Elinor Parkinson. Worthy Rancher, M.D.

## 2011-03-14 NOTE — Assessment & Plan Note (Signed)
[facility]                              [facility] OFFICE NOTE   NAME:Donna Webb, Donna Webb                     MRN:          [id number]  DATE:06/02/2006                            DOB:          [birth year]    This is a 70 year old woman, here for a complete physical examination.  She  has no complaints today, and seems to be doing well, even after the death of  her husband a little over a year ago.  She remains quite active.  She  actually goes to exercise classes at a local [employer] about 3 days a week.  She  had a normal mammogram in April of this year.  She continues to check her  blood pressures at home, and they are stable.   For details of her past medical history, family history, social history,  habits, etcetera, refer to her last physical note dated June 02, 2005.   ALLERGIES:  NORVASC.   CURRENT MEDICATIONS:  1.  Atenolol 50 mg b.i.d.  2.  Aspirin 81 mg per day.  3.  Multivitamins daily.  4.  HCTZ 25 mg per day.  5.  Clonidine 0.1 mg 1-1/2 tablet b.i.d.  6.  Calcium and vitamin D daily.  7.  Diprolene AF cream twice daily as needed for eczema.   OBJECTIVE:  VITAL SIGNS:  Height 5 feet 0 inches, weight 126, BP 130/100  originally, when I rechecked it later it was 138/88, pulse 70 and regular.  GENERAL:  She appears to be at her baseline.  SKIN:  Free of significant lesions.  HEENT:  Eyes clear sclerae, clear oropharynx.  NECK:  Supple without lymphadenopathy or masses.  LUNGS:  Clear.  CARDIAC:  Rate and rhythm regular without gallops, murmurs or rubs.  Distal  pulses are full.  EKG is within normal limits.  CHEST:  Breasts and axillae are clear.  ABDOMEN:  Soft, normal bowel sounds, nontender, no masses.  GENITOURINARY:  External genitalia are mildly atrophic.  We did not do a  pelvic exam today.  RECTAL:  No masses or tenderness.  Stool is Hemoccult negative.  EXTREMITIES:  No clubbing, cyanosis or edema.  NEUROLOGIC:  Exam is  grossly intact.   She was here for fasting labs on July 31.  This is remarkable only for an  abnormal lipid panel.  Triglycerides were up to 182, LDL was up to 164, HDL  was excellent at 56.   ASSESSMENT AND PLAN:  1.  Complete physical exam.  I encouraged her to continue with her exercise      efforts.  2.  Hypertension.  Stable.  3.  Hyperlipidemia.  I encouraged her to watch her diet a little more      closely.  4.  Eczema.  Stable.                                   Tera Mater. Clent Ridges, MD   SAF/MedQ  DD:  06/02/2006  DT:  06/03/2006  Job #:  [id number]

## 2011-07-29 LAB — DIFFERENTIAL
Eosinophils Relative: 2
Lymphocytes Relative: 19
Monocytes Absolute: 0.7
Monocytes Relative: 6
Neutro Abs: 9 — ABNORMAL HIGH

## 2011-07-29 LAB — COMPREHENSIVE METABOLIC PANEL
AST: 24
Albumin: 4.3
BUN: 8
Chloride: 93 — ABNORMAL LOW
Creatinine, Ser: 0.6
GFR calc Af Amer: >60
Potassium: 4.6
Total Bilirubin: 0.8
Total Protein: 7.2

## 2011-07-29 LAB — CBC
HCT: 44.1
MCV: 92.8
Platelets: 770 — ABNORMAL HIGH
RDW: 13.5
WBC: 12.3 — ABNORMAL HIGH

## 2011-08-02 ENCOUNTER — Other Ambulatory Visit: Payer: Self-pay | Admitting: Family Medicine

## 2011-09-22 ENCOUNTER — Ambulatory Visit (INDEPENDENT_AMBULATORY_CARE_PROVIDER_SITE_OTHER): Payer: Medicare Other | Admitting: Family Medicine

## 2011-09-22 ENCOUNTER — Encounter: Payer: Self-pay | Admitting: Family Medicine

## 2011-09-22 VITALS — BP 124/82 | HR 75 | Temp 98.0°F | Ht 59.75 in | Wt 123.0 lb

## 2011-09-22 DIAGNOSIS — I10 Essential (primary) hypertension: Secondary | ICD-10-CM

## 2011-09-22 DIAGNOSIS — G589 Mononeuropathy, unspecified: Secondary | ICD-10-CM

## 2011-09-22 DIAGNOSIS — Z136 Encounter for screening for cardiovascular disorders: Secondary | ICD-10-CM

## 2011-09-22 DIAGNOSIS — G629 Polyneuropathy, unspecified: Secondary | ICD-10-CM

## 2011-09-22 DIAGNOSIS — E871 Hypo-osmolality and hyponatremia: Secondary | ICD-10-CM

## 2011-09-22 DIAGNOSIS — E785 Hyperlipidemia, unspecified: Secondary | ICD-10-CM

## 2011-09-22 LAB — CBC WITH DIFFERENTIAL/PLATELET
Basophils Relative: 0.4 % (ref 0.0–3.0)
Eosinophils Absolute: 0.3 10*3/uL (ref 0.0–0.7)
HCT: 42.7 % (ref 36.0–46.0)
Hemoglobin: 14.2 g/dL (ref 12.0–15.0)
Lymphocytes Relative: 20.5 % (ref 12.0–46.0)
Lymphs Abs: 2.7 10*3/uL (ref 0.7–4.0)
MCHC: 33.3 g/dL (ref 30.0–36.0)
Monocytes Relative: 5.7 % (ref 3.0–12.0)
Neutro Abs: 9.4 10*3/uL — ABNORMAL HIGH (ref 1.4–7.7)
RBC: 4.49 Mil/uL (ref 3.87–5.11)

## 2011-09-22 LAB — POCT URINALYSIS DIPSTICK
Bilirubin, UA: NEGATIVE
Ketones, UA: NEGATIVE
Leukocytes, UA: NEGATIVE
Spec Grav, UA: 1.015
pH, UA: 7

## 2011-09-22 LAB — BASIC METABOLIC PANEL
Chloride: 101 mEq/L (ref 96–112)
GFR: 102.84 mL/min (ref >60.00)
Potassium: 3.7 mEq/L (ref 3.5–5.1)
Sodium: 138 mEq/L (ref 135–145)

## 2011-09-22 LAB — HEPATIC FUNCTION PANEL
ALT: 14 U/L (ref 0–35)
AST: 25 U/L (ref 0–37)
Alkaline Phosphatase: 43 U/L (ref 39–117)
Bilirubin, Direct: 0.1 mg/dL (ref 0.0–0.3)
Total Bilirubin: 0.7 mg/dL (ref 0.3–1.2)

## 2011-09-22 LAB — LIPID PANEL
HDL: 59.9 mg/dL (ref >39.00)
Total CHOL/HDL Ratio: 3
VLDL: 33.2 mg/dL (ref 0.0–40.0)

## 2011-09-22 NOTE — Progress Notes (Signed)
  Subjective:    Patient ID: Donna Webb, female    DOB: 07/02/1941, 70 y.o.   MRN: [id number]  HPI 70 yr old female for cpx. She feels well and has no complaints. Her BP is stable. She sees Dr. Terri Piedra twice a year for skin exams. She sees Dr. Talmage Nap to check on her sodium levels.    Review of Systems  Constitutional: Negative.   HENT: Negative.   Eyes: Negative.   Respiratory: Negative.   Cardiovascular: Negative.   Gastrointestinal: Negative.   Genitourinary: Negative for dysuria, urgency, frequency, hematuria, flank pain, decreased urine volume, enuresis, difficulty urinating, pelvic pain and dyspareunia.  Musculoskeletal: Negative.   Skin: Negative.   Neurological: Negative.   Hematological: Negative.   Psychiatric/Behavioral: Negative.        Objective:   Physical Exam  Constitutional: She is oriented to person, place, and time. She appears well-developed and well-nourished. No distress.  HENT:  Head: Normocephalic and atraumatic.  Right Ear: External ear normal.  Left Ear: External ear normal.  Nose: Nose normal.  Mouth/Throat: Oropharynx is clear and moist. No oropharyngeal exudate.  Eyes: Conjunctivae and EOM are normal. Pupils are equal, round, and reactive to light. No scleral icterus.  Neck: Normal range of motion. Neck supple. No JVD present. No thyromegaly present.  Cardiovascular: Normal rate, regular rhythm, normal heart sounds and intact distal pulses.  Exam reveals no gallop and no friction rub.   No murmur heard.      EKG normal   Pulmonary/Chest: Effort normal and breath sounds normal. No respiratory distress. She has no wheezes. She has no rales. She exhibits no tenderness.  Abdominal: Soft. Bowel sounds are normal. She exhibits no distension and no mass. There is no tenderness. There is no rebound and no guarding.  Genitourinary: No breast swelling, tenderness, discharge or bleeding.  Musculoskeletal: Normal range of motion. She exhibits no edema and no  tenderness.  Lymphadenopathy:    She has no cervical adenopathy.  Neurological: She is alert and oriented to person, place, and time. She has normal reflexes. No cranial nerve deficit. She exhibits normal muscle tone. Coordination normal.  Skin: Skin is warm and dry. No rash noted. No erythema.  Psychiatric: She has a normal mood and affect. Her behavior is normal. Judgment and thought content normal.          Assessment & Plan:  She seems to be doing quite well. Get fasting labs today.

## 2011-09-25 NOTE — Progress Notes (Signed)
Quick Note:  Spoke with pt ______ 

## 2011-10-19 ENCOUNTER — Other Ambulatory Visit: Payer: Self-pay | Admitting: Family Medicine

## 2011-10-31 ENCOUNTER — Other Ambulatory Visit: Payer: Self-pay | Admitting: Family Medicine

## 2011-10-31 NOTE — Telephone Encounter (Signed)
Okay for one year of both  

## 2011-12-09 ENCOUNTER — Ambulatory Visit (INDEPENDENT_AMBULATORY_CARE_PROVIDER_SITE_OTHER): Payer: Medicare Other | Admitting: Family Medicine

## 2011-12-09 ENCOUNTER — Encounter: Payer: Self-pay | Admitting: Family Medicine

## 2011-12-09 VITALS — BP 118/76 | HR 94 | Temp 97.8°F | Wt 114.0 lb

## 2011-12-09 DIAGNOSIS — R410 Disorientation, unspecified: Secondary | ICD-10-CM

## 2011-12-09 DIAGNOSIS — I10 Essential (primary) hypertension: Secondary | ICD-10-CM

## 2011-12-09 DIAGNOSIS — E538 Deficiency of other specified B group vitamins: Secondary | ICD-10-CM

## 2011-12-09 DIAGNOSIS — F29 Unspecified psychosis not due to a substance or known physiological condition: Secondary | ICD-10-CM

## 2011-12-09 LAB — CBC WITH DIFFERENTIAL/PLATELET
Basophils Relative: 0.3 % (ref 0.0–3.0)
Eosinophils Relative: 1.1 % (ref 0.0–5.0)
HCT: 49.4 % — ABNORMAL HIGH (ref 36.0–46.0)
Hemoglobin: 15.9 g/dL — ABNORMAL HIGH (ref 12.0–15.0)
Lymphs Abs: 2.7 10*3/uL (ref 0.7–4.0)
MCV: 96.1 fl (ref 78.0–100.0)
Monocytes Absolute: 1.5 10*3/uL — ABNORMAL HIGH (ref 0.1–1.0)
Monocytes Relative: 8.4 % (ref 3.0–12.0)
Neutro Abs: 13.8 10*3/uL — ABNORMAL HIGH (ref 1.4–7.7)
Platelets: 763 10*3/uL — ABNORMAL HIGH (ref 150.0–400.0)
RBC: 5.14 Mil/uL — ABNORMAL HIGH (ref 3.87–5.11)
WBC: 18.3 10*3/uL (ref 4.5–10.5)

## 2011-12-09 LAB — POCT URINALYSIS DIPSTICK
Blood, UA: NEGATIVE
Glucose, UA: NEGATIVE
Spec Grav, UA: 1.01
Urobilinogen, UA: 0.2

## 2011-12-09 LAB — HEPATIC FUNCTION PANEL
Alkaline Phosphatase: 49 U/L (ref 39–117)
Bilirubin, Direct: 0 mg/dL (ref 0.0–0.3)
Total Bilirubin: 0.6 mg/dL (ref 0.3–1.2)
Total Protein: 7.5 g/dL (ref 6.0–8.3)

## 2011-12-09 LAB — BASIC METABOLIC PANEL
CO2: 28 mEq/L (ref 19–32)
Calcium: 11.1 mg/dL — ABNORMAL HIGH (ref 8.4–10.5)
Chloride: 97 mEq/L (ref 96–112)
Sodium: 136 mEq/L (ref 135–145)

## 2011-12-09 NOTE — Progress Notes (Signed)
  Subjective:    Patient ID: Donna Webb, female    DOB: 03-Aug-1941, 71 y.o.   MRN: [id number]  HPI Here with her sister for several weeks of mild confusion, difficulty putting her thoughts into words, and some mild dysequilibrium on walking. This has come on gradually. No HA or vision changes. No weakness or numbness. No SOB or chest pains. No nausea or fever. She has apparently been very compliant with her medications, and her sister assures me she has not taken too much. We have followed some hyponatremia that she has had for several years, and last year she saw Dr. Dorisann Frames for this. She was ruled out for adrenal insufficiency, and Dr. Talmage Nap basically ended up changing her diet and telling her to restrict her fluid intake. Donna Webb did so, and in fact her electrolytes were normal when we did her cpx last November.    Review of Systems  Constitutional: Positive for appetite change, fatigue and unexpected weight change. Negative for fever.  HENT: Negative.   Eyes: Negative.   Respiratory: Negative.   Cardiovascular: Negative.   Gastrointestinal: Negative.   Genitourinary: Negative.   Neurological: Positive for speech difficulty. Negative for dizziness, tremors, seizures, syncope, facial asymmetry, weakness, light-headedness, numbness and headaches.       Objective:   Physical Exam  Constitutional: She is oriented to person, place, and time. She appears well-developed and well-nourished.       Alert. Walks slowly with some assistance, gait is slightly unsteady  Eyes: Conjunctivae are normal. Pupils are equal, round, and reactive to light.  Neck: No thyromegaly present.  Cardiovascular: Normal rate, regular rhythm, normal heart sounds and intact distal pulses.  Exam reveals no gallop and no friction rub.   No murmur heard. Pulmonary/Chest: Effort normal and breath sounds normal. No respiratory distress. She has no wheezes. She has no rales. She exhibits no tenderness.  Abdominal:  Soft. Bowel sounds are normal. She exhibits no distension and no mass. There is no tenderness. There is no rebound and no guarding.  Lymphadenopathy:    She has no cervical adenopathy.  Neurological: She is alert and oriented to person, place, and time. She displays normal reflexes. No cranial nerve deficit. She exhibits normal muscle tone.       She does have some trouble putting her thoughts into words but she can communicate if she takes her time. No slurring          Assessment & Plan:  Confusion, gait instability, and expressive aphasia which could have many causes. I think it is very likely that her sodium level has dropped again. We will get labs this morning, and we should be able to call her with results this afternoon. Her sister drove her today and will stay with Donna Webb at her house for the time being.

## 2011-12-10 ENCOUNTER — Emergency Department (HOSPITAL_COMMUNITY): Payer: Medicare Other

## 2011-12-10 ENCOUNTER — Encounter (HOSPITAL_COMMUNITY): Payer: Self-pay | Admitting: *Deleted

## 2011-12-10 ENCOUNTER — Emergency Department (HOSPITAL_COMMUNITY)
Admission: EM | Admit: 2011-12-10 | Discharge: 2011-12-10 | Disposition: A | Discharge status: Home / Self Care | Payer: Medicare Other | Attending: Emergency Medicine | Admitting: Emergency Medicine

## 2011-12-10 ENCOUNTER — Other Ambulatory Visit: Payer: Self-pay

## 2011-12-10 DIAGNOSIS — R5381 Other malaise: Secondary | ICD-10-CM | POA: Insufficient documentation

## 2011-12-10 DIAGNOSIS — R5383 Other fatigue: Secondary | ICD-10-CM

## 2011-12-10 DIAGNOSIS — F29 Unspecified psychosis not due to a substance or known physiological condition: Secondary | ICD-10-CM | POA: Insufficient documentation

## 2011-12-10 DIAGNOSIS — E785 Hyperlipidemia, unspecified: Secondary | ICD-10-CM | POA: Insufficient documentation

## 2011-12-10 DIAGNOSIS — I6789 Other cerebrovascular disease: Secondary | ICD-10-CM | POA: Insufficient documentation

## 2011-12-10 DIAGNOSIS — Z85820 Personal history of malignant melanoma of skin: Secondary | ICD-10-CM | POA: Insufficient documentation

## 2011-12-10 DIAGNOSIS — Z79899 Other long term (current) drug therapy: Secondary | ICD-10-CM | POA: Insufficient documentation

## 2011-12-10 DIAGNOSIS — B9789 Other viral agents as the cause of diseases classified elsewhere: Secondary | ICD-10-CM | POA: Insufficient documentation

## 2011-12-10 DIAGNOSIS — I10 Essential (primary) hypertension: Secondary | ICD-10-CM | POA: Insufficient documentation

## 2011-12-10 DIAGNOSIS — B349 Viral infection, unspecified: Secondary | ICD-10-CM

## 2011-12-10 LAB — URINALYSIS, ROUTINE W REFLEX MICROSCOPIC
Glucose, UA: NEGATIVE mg/dL
Leukocytes, UA: NEGATIVE
Protein, ur: NEGATIVE mg/dL
Urobilinogen, UA: 0.2 mg/dL (ref 0.0–1.0)

## 2011-12-10 LAB — COMPREHENSIVE METABOLIC PANEL
BUN: 13 mg/dL (ref 6–23)
CO2: 26 mEq/L (ref 19–32)
Calcium: 10.8 mg/dL — ABNORMAL HIGH (ref 8.4–10.5)
Chloride: 100 mEq/L (ref 96–112)
Creatinine, Ser: 0.73 mg/dL (ref 0.50–1.10)
GFR calc Af Amer: >90 mL/min (ref >90)
GFR calc non Af Amer: 85 mL/min — ABNORMAL LOW (ref >90)
Glucose, Bld: 100 mg/dL — ABNORMAL HIGH (ref 70–99)
Total Bilirubin: 0.4 mg/dL (ref 0.3–1.2)

## 2011-12-10 LAB — CBC
HCT: 43.7 % (ref 36.0–46.0)
Hemoglobin: 15.3 g/dL — ABNORMAL HIGH (ref 12.0–15.0)
MCV: 90.1 fL (ref 78.0–100.0)
RDW: 12.5 % (ref 11.5–15.5)
WBC: 16.6 10*3/uL — ABNORMAL HIGH (ref 4.0–10.5)

## 2011-12-10 LAB — DIFFERENTIAL
Eosinophils Relative: 2 % (ref 0–5)
Lymphocytes Relative: 19 % (ref 12–46)
Lymphs Abs: 3.2 10*3/uL (ref 0.7–4.0)
Monocytes Absolute: 1.5 10*3/uL — ABNORMAL HIGH (ref 0.1–1.0)
Monocytes Relative: 9 % (ref 3–12)
Neutro Abs: 11.4 10*3/uL — ABNORMAL HIGH (ref 1.7–7.7)

## 2011-12-10 LAB — GLUCOSE, CAPILLARY: Glucose-Capillary: 106 mg/dL — ABNORMAL HIGH (ref 70–99)

## 2011-12-10 LAB — VITAMIN B12: Vitamin B-12: >1500 pg/mL — ABNORMAL HIGH (ref 211–911)

## 2011-12-10 LAB — TSH: TSH: 0.47 u[IU]/mL (ref 0.35–5.50)

## 2011-12-10 NOTE — ED Notes (Signed)
Pt returned from CT °

## 2011-12-10 NOTE — ED Notes (Signed)
D/c pending [facility], EDP speaking with pt's PCP. Will d/c after this. Pt sitting in chair at bedside with sister.

## 2011-12-10 NOTE — Progress Notes (Signed)
Quick Note:  I spoke with sister and gave results, also she is going to take pt to [facility] ER. I called the ER and gave update about pt to charge nurse. ______

## 2011-12-10 NOTE — Discharge Instructions (Signed)
Viral Infections A viral infection can be caused by different types of viruses.Most viral infections are not serious and resolve on their own. However, some infections may cause severe symptoms and may lead to further complications. SYMPTOMS Viruses can frequently cause:  Minor sore throat.   Aches and pains.   Headaches.   Runny nose.   Different types of rashes.   Watery eyes.   Tiredness.   Cough.   Loss of appetite.   Gastrointestinal infections, resulting in nausea, vomiting, and diarrhea.  These symptoms do not respond to antibiotics because the infection is not caused by bacteria. However, you might catch a bacterial infection following the viral infection. This is sometimes called a "superinfection." Symptoms of such a bacterial infection may include:  Worsening sore throat with pus and difficulty swallowing.   Swollen neck glands.   Chills and a high or persistent fever.   Severe headache.   Tenderness over the sinuses.   Persistent overall ill feeling (malaise), muscle aches, and tiredness (fatigue).   Persistent cough.   Yellow, green, or brown mucus production with coughing.  HOME CARE INSTRUCTIONS   Only take over-the-counter or prescription medicines for pain, discomfort, diarrhea, or fever as directed by your caregiver.   Drink enough water and fluids to keep your urine clear or pale yellow. Sports drinks can provide valuable electrolytes, sugars, and hydration.   Get plenty of rest and maintain proper nutrition. Soups and broths with crackers or rice are fine.  SEEK IMMEDIATE MEDICAL CARE IF:   You have severe headaches, shortness of breath, chest pain, neck pain, or an unusual rash.   You have uncontrolled vomiting, diarrhea, or you are unable to keep down fluids.   You or your child has an oral temperature above 102 F (38.9 C), not controlled by medicine.   Your baby is older than 3 months with a rectal temperature of 102 F (38.9 C) or  higher.   Your baby is 3 months old or younger with a rectal temperature of 100.4 F (38 C) or higher.  MAKE SURE YOU:   Understand these instructions.   Will watch your condition.   Will get help right away if you are not doing well or get worse.  Document Released: 07/23/2005 Document Revised: 06/25/2011 Document Reviewed: 02/17/2011 Exit[employer] Patient Information 2012 [employer]. 

## 2011-12-10 NOTE — ED Notes (Signed)
Pt's sister reports pt has been feeling weak and tired recently. Went to PCP and had lab work done. Was called back today and told to bring pt to ED for IV abx. No known source of infection, elevated WBC per sister. Electrolytes normal. Pt A&Ox4, sts she is really tired. Denies n/v, cough, abd pain, urinary symptoms. No specific complaint other than tiredness.

## 2011-12-10 NOTE — ED Provider Notes (Signed)
History     CSN: [id number]  Arrival date & time 12/10/11  1242   First MD Initiated Contact with Patient 12/10/11 1332      Chief Complaint  Patient presents with  . Weakness    pt was seen yesterday by PCP, had labs drawn and was told WBC elevated, possibly dehydrated and infection? pt reports feeling weak.    (Consider location/radiation/quality/duration/timing/severity/associated sxs/prior treatment) HPI Patient is a pleasant 71 year old female who was referred today by her primary care physician after being seen yesterday in the office for some difficulties with word finding as well as fatigue and 10 pound weight loss over the past few months. Patient at that time had a white count of 18. Other laboratory workup was performed but family is unaware of what that was. Patient actually feels better than she fell yesterday and went to exercise at the [facility] this morning. She still reports that she is about half of her normal functioning level. She denies any fevers or cough. She denies abdominal pain, nausea, vomiting, consultation, or diarrhea. She denies urinary symptoms. Patient reports only reason is here is because she was told she would need to be evaluated. Her friend who is at the bedside reports that she is much improved from yesterday. She is at her neurologic baseline as well.There are no other associated or modifying factors.  Past Medical History  Diagnosis Date  . Hypertension   . Hyperlipemia   . Eczema   . Melanoma     Dr Para Skeans   . Osteopenia 01-03-09    per DEXA   . Hyponatremia     Past Surgical History  Procedure Date  . Ovarian cyst removal   . Melanoma excision 07-27-08    from back per Dr. Emelia Loron   . Colonoscopy     she refuses to get any     Family History  Problem Relation Age of Onset  . Coronary artery disease      relative <50  . Alzheimer's disease      family hx  . Anemia      family hx   . Heart disease Father     History    Substance Use Topics  . Smoking status: Current Everyday Smoker    Types: Cigarettes  . Smokeless tobacco: Never Used   Comment: smokes 1 a day  . Alcohol Use: 3.5 oz/week    7 drink(s) per week    OB History    Grav Para Term Preterm Abortions TAB SAB Ect Mult Living                  Review of Systems  Constitutional: Positive for fatigue and unexpected weight change.  HENT: Negative.   Eyes: Negative.   Respiratory: Negative.   Cardiovascular: Negative.   Gastrointestinal: Negative.   Genitourinary: Negative.   Musculoskeletal: Negative.   Skin: Negative.   Neurological: Positive for speech difficulty.  Hematological: Negative.   Psychiatric/Behavioral: Negative.   All other systems reviewed and are negative.    Allergies  Amlodipine besylate  Home Medications   Current Outpatient Rx  Name Route Sig Dispense Refill  . ATENOLOL 50 MG PO TABS  TAKE 1 TABLET TWO TIMES A DAY 180 tablet 3  . BETAMETHASONE DIPROPIONATE 0.05 % EX CREA Topical Apply topically 2 (two) times daily. 45 g 11  . CALCIUM-VITAMIN D 500-200 MG-UNIT PO TABS Oral Take 1 tablet by mouth 2 (two) times daily with meals.      Marland Kitchen  CLONIDINE HCL 0.2 MG PO TABS  TAKE 1 TABLET TWO TIMES A DAY 180 tablet 3  . LISINOPRIL 20 MG PO TABS  TAKE 1 TABLET DAILY 90 tablet 0  . ONE-DAILY MULTI VITAMINS PO TABS Oral Take 1 tablet by mouth daily.        BP 125/80  Pulse 86  Temp(Src) 98 F (36.7 C) (Oral)  Resp 20  Wt 110 lb (49.896 kg)  SpO2 99%  Physical Exam  Nursing note and vitals reviewed. GEN: Well-developed, well-nourished female in no distress HEENT: Atraumatic, normocephalic. Oropharynx clear without erythema EYES: PERRLA BL, no scleral icterus. NECK: Trachea midline, no meningismus CV: regular rate and rhythm. No murmurs, rubs, or gallops PULM: No respiratory distress.  No crackles, wheezes, or rales. GI: soft, non-tender. No guarding, rebound, or tenderness. + bowel sounds  Neuro: cranial  nerves 2-12 intact, no abnormalities of strength or sensation, A and O x 3, patient has slightly slowed speech MSK: Patient moves all 4 extremities symmetrically, no deformity, edema, or injury noted Psych: no abnormality of mood   ED Course  Procedures (including critical care time)   Date: 12/10/2011  Rate: 74  Rhythm: normal sinus rhythm  QRS Axis: normal  Intervals: normal  ST/T Wave abnormalities: nonspecific T wave changes  Conduction Disutrbances:none  Narrative Interpretation:   Old EKG Reviewed: unchanged   Labs Reviewed  CBC - Abnormal; Notable for the following:    WBC 16.6 (*)    Hemoglobin 15.3 (*)    Platelets 695 (*)    All other components within normal limits  DIFFERENTIAL - Abnormal; Notable for the following:    Neutro Abs 11.4 (*)    Monocytes Absolute 1.5 (*)    All other components within normal limits  COMPREHENSIVE METABOLIC PANEL - Abnormal; Notable for the following:    Glucose, Bld 100 (*)    Calcium 10.8 (*)    GFR calc non Af Amer 85 (*)    All other components within normal limits  URINALYSIS, ROUTINE W REFLEX MICROSCOPIC - Abnormal; Notable for the following:    APPearance CLOUDY (*)    Ketones, ur TRACE (*)    All other components within normal limits  GLUCOSE, CAPILLARY - Abnormal; Notable for the following:    Glucose-Capillary 106 (*)    All other components within normal limits  AMMONIA   Dg Chest 2 View  12/10/2011  *RADIOLOGY REPORT*  Clinical Data: Weakness.  CHEST - 2 VIEW  Comparison: CT chest 08/14/2008 and chest radiograph 08/10/2008.  Findings: Trachea is midline.  Heart size normal.  Thoracic aorta is calcified.  Biapical pleural thickening.  Lungs are clear.  No pleural fluid.  IMPRESSION: No acute findings.  Original Report Authenticated By: Reyes Ivan, M.D.   Ct Head Wo Contrast  12/10/2011  *RADIOLOGY REPORT*  Clinical Data: Weakness.  Confusion.  CT HEAD WITHOUT CONTRAST  Technique:  Contiguous axial images were  obtained from the base of the skull through the vertex without contrast.  Comparison: None.  Findings: There is atrophy and chronic small vessel disease changes. No acute intracranial abnormality.  Specifically, no hemorrhage, hydrocephalus, mass lesion, acute infarction, or significant intracranial injury.  No acute calvarial abnormality. Visualized paranasal sinuses and mastoids clear.  Orbital soft tissues unremarkable.  IMPRESSION: No acute intracranial abnormality.  Atrophy, chronic microvascular disease.  Original Report Authenticated By: Cyndie Chime, M.D.     1. Fatigue   2. Viral illness       MDM  I spoke with the patient who presented today for reevaluation given her condition her primary care provider. Today white count was 16. Remainder of laboratory workup was within normal limits. Patient had normal renal function as well as urinalysis, ammonia, and CMP. Chest x-ray was performed and no signs of pathology. Additionally because of the report of some possible confusion and word finding difficulties a CT of the head was performed which also was unremarkable. I spoke with the patient's primary care physician Dr. Clent Ridges. He was comfortable with plan for patient be discharged home and see him in the next one to 2 days. Patient was discharged in good condition.        Cyndra Numbers, MD 12/10/11 1901

## 2011-12-12 ENCOUNTER — Encounter: Payer: Self-pay | Admitting: Family Medicine

## 2011-12-12 ENCOUNTER — Ambulatory Visit (INDEPENDENT_AMBULATORY_CARE_PROVIDER_SITE_OTHER): Payer: Medicare Other | Admitting: Family Medicine

## 2011-12-12 VITALS — BP 104/72 | HR 79 | Temp 97.8°F | Wt 110.0 lb

## 2011-12-12 DIAGNOSIS — D72829 Elevated white blood cell count, unspecified: Secondary | ICD-10-CM

## 2011-12-12 DIAGNOSIS — R531 Weakness: Secondary | ICD-10-CM

## 2011-12-12 DIAGNOSIS — R5381 Other malaise: Secondary | ICD-10-CM

## 2011-12-12 NOTE — Progress Notes (Signed)
  Subjective:    Patient ID: Donna Webb, female    DOB: 12-29-1940, 71 y.o.   MRN: [id number]  HPI Here with her sister to follow up a visit to the ER on 12-10-11 after we directed her there for weakness, confusion, and weight loss. We did labs here the day before that were remarkable only for a very elevated WBC count of 18.3 and a platelet count of 763. At the ER all tests were normal except a WBC count of 16.6 and elevated platelets. Her CXR was clear, and her head CT was normal except for some age related atrophy. She felt better that day, so they sent her home to follow up with [place] today. Today she feels a little better, a little stronger, and her appetite is a little improved. No new symptoms.    Review of Systems  Constitutional: Positive for fatigue. Negative for fever.  HENT: Negative.   Eyes: Negative.   Respiratory: Negative.   Cardiovascular: Negative.   Gastrointestinal: Negative.   Genitourinary: Negative.   Neurological: Negative.        Objective:   Physical Exam  Constitutional: She is oriented to person, place, and time.       She does look a little better today, more animated, and her speech is clearer   Neck: Neck supple. No thyromegaly present.  Cardiovascular: Normal rate, regular rhythm, normal heart sounds and intact distal pulses.   Pulmonary/Chest: Effort normal and breath sounds normal.  Lymphadenopathy:    She has no cervical adenopathy.  Neurological: She is alert and oriented to person, place, and time.          Assessment & Plan:  It is still unclear what the etiology of her recent weakness and confusion may be, but chances are it is related to the abnormalities seen on her CBC. She is still at home but her sister is staying with her. We will refer to Heme/Onc soon

## 2011-12-16 ENCOUNTER — Ambulatory Visit (HOSPITAL_BASED_OUTPATIENT_CLINIC_OR_DEPARTMENT_OTHER): Payer: Medicare Other | Admitting: Oncology

## 2011-12-16 ENCOUNTER — Other Ambulatory Visit: Payer: Self-pay | Admitting: Oncology

## 2011-12-16 ENCOUNTER — Ambulatory Visit (HOSPITAL_BASED_OUTPATIENT_CLINIC_OR_DEPARTMENT_OTHER): Payer: Medicare Other | Admitting: Lab

## 2011-12-16 ENCOUNTER — Telehealth: Payer: Self-pay | Admitting: *Deleted

## 2011-12-16 ENCOUNTER — Ambulatory Visit: Payer: Medicare Other

## 2011-12-16 VITALS — BP 138/93 | HR 80 | Temp 97.8°F | Ht 59.5 in | Wt 111.3 lb

## 2011-12-16 DIAGNOSIS — D473 Essential (hemorrhagic) thrombocythemia: Secondary | ICD-10-CM

## 2011-12-16 DIAGNOSIS — R471 Dysarthria and anarthria: Secondary | ICD-10-CM

## 2011-12-16 LAB — COMPREHENSIVE METABOLIC PANEL
ALT: 36 U/L — ABNORMAL HIGH (ref 0–35)
AST: 45 U/L — ABNORMAL HIGH (ref 0–37)
CO2: 25 mEq/L (ref 19–32)
Chloride: 101 mEq/L (ref 96–112)
Creatinine, Ser: 0.89 mg/dL (ref 0.50–1.10)
Sodium: 139 mEq/L (ref 135–145)
Total Bilirubin: 0.6 mg/dL (ref 0.3–1.2)
Total Protein: 6.8 g/dL (ref 6.0–8.3)

## 2011-12-16 LAB — CBC & DIFF AND RETIC
Basophils Absolute: 0.1 10*3/uL (ref 0.0–0.1)
Eosinophils Absolute: 0.4 10*3/uL (ref 0.0–0.5)
HCT: 46.8 % — ABNORMAL HIGH (ref 34.8–46.6)
LYMPH%: 14.8 % (ref 14.0–49.7)
MCV: 88.6 fL (ref 79.5–101.0)
MONO#: 1.3 10*3/uL — ABNORMAL HIGH (ref 0.1–0.9)
MONO%: 7.8 % (ref 0.0–14.0)
NEUT#: 12.4 10*3/uL — ABNORMAL HIGH (ref 1.5–6.5)
NEUT%: 74.7 % (ref 38.4–76.8)
Platelets: 827 10*3/uL — ABNORMAL HIGH (ref 145–400)
WBC: 16.7 10*3/uL — ABNORMAL HIGH (ref 3.9–10.3)

## 2011-12-16 LAB — MORPHOLOGY: PLT EST: INCREASED

## 2011-12-16 NOTE — Progress Notes (Signed)
Referral MD Dr Laurice Record Reason for Referral: Thrombocytosis and leukocytosis   Chief Complaint  Patient presents with  . New Evaluation  : This is a 71 year old woman from [place] here with her sister for evaluation of elevated platelet and white cell count. The patient had presented to her primary care Dr. with and episode and confusion weakness. She had a history weight loss as well. She did have a CT scan of the head which showed some atrophy and microvascular changes. She was noted to have an elevated white count of 763,000 white count of 18.3 she did not have a recent history of a infectious process. She has loss some weight but denies night sweats or fevers. She has been feeling reasonably well back her sister is noted a gradual decrease in her overall status over the past month or so. She has not had any bleeding or bruising noted. Is been reduced and her weight is down about 11 pounds.   HPI:   Past Medical History  Diagnosis Date  . Hypertension   . Hyperlipemia   . Eczema   . Melanoma     Dr Para Skeans   . Osteopenia 01-03-09    per DEXA   . Hyponatremia   :  Past Surgical History  Procedure Date  . Ovarian cyst removal   . Melanoma excision 07-27-08    from back per Dr. Emelia Loron   . Colonoscopy     she refuses to get any   :  Current outpatient prescriptions:atenolol (TENORMIN) 50 MG tablet, TAKE 1 TABLET TWO TIMES A DAY, Disp: 180 tablet, Rfl: 3;  betamethasone dipropionate (DIPROLENE) 0.05 % cream, Apply topically 2 (two) times daily., Disp: 45 g, Rfl: 11;  Calcium Carbonate-Vitamin D (CALCIUM-VITAMIN D) 500-200 MG-UNIT per tablet, Take 1 tablet by mouth 2 (two) times daily with meals.  , Disp: , Rfl:  cloNIDine (CATAPRES) 0.2 MG tablet, TAKE 1 TABLET TWO TIMES A DAY, Disp: 180 tablet, Rfl: 3;  lisinopril (PRINIVIL,ZESTRIL) 20 MG tablet, TAKE 1 TABLET DAILY, Disp: 90 tablet, Rfl: 0;  Multiple Vitamin (MULTIVITAMIN) tablet, Take 1 tablet by mouth daily.  , Disp:  , Rfl: :    :  Allergies  Allergen Reactions  . Amlodipine Besylate     REACTION: intolerance  :  Family History  Problem Relation Age of Onset  . Coronary artery disease      relative <50  . Alzheimer's disease      family hx  . Anemia      family hx   . Heart disease Father   :  History   Social History  . Marital Status: Widowed    Spouse Name: N/A    Number of Children: 0  . 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     Social History Main Topics  . Smoking status: Former Smoker 50 p/y     Types: Cigarettes  . Smokeless tobacco: Never Used   Comment: smokes 1 a day  . Alcohol Use: Previous heavy etoh use  . Drug Use: No  . Sexually Active: Not on file   Other Topics Concern  . Not on file   Social History Narrative  . No narrative on file  :  Pertinent items are noted in HPI.  Exam: @IPVITALS @ General appearance: alert, cooperative, appears stated age and distracted, slightly  disheveled appearance Eyes: conjunctivae/corneas clear. PERRL, EOM's intact. Fundi benign. Throat: lips, mucosa, and tongue normal; poor dentition Neck: no adenopathy, no carotid bruit, no JVD, supple, symmetrical, trachea midline and thyroid not enlarged, symmetric, no tenderness/mass/nodules Breasts: normal appearance, no masses or tenderness Cardio: regular rate and rhythm, S1, S2 normal, no murmur, click, rub or gallop and normal apical impulse GI: soft, non-tender; bowel sounds normal; no masses,  no organomegaly Extremities: extremities normal, atraumatic, no cyanosis or edema Pulses: 2+ and symmetric Lymph nodes: Cervical, supraclavicular, and axillary nodes normal.    Blood smear review: Results for KAMARI, BILEK (MRN [id number]) as of 12/16/2011 12:16  Ref. Range 12/16/2011 10:13  White Cell Comments No range found C/W auto diff  Platelet  Morphology Latest Range: Within Normal Limits  Large and giant platelets  Polychromasia Latest Range: Slight  Slight  Tear Drop Cells Latest Range: Negative  Few  Ovalocytes Latest Range: Negative  Few  PLT EST Latest Range: Adequate  Increased    Pathology:n/a  Dg Chest 2 View  12/10/2011  *RADIOLOGY REPORT*  Clinical Data: Weakness.  CHEST - 2 VIEW  Comparison: CT chest 08/14/2008 and chest radiograph 08/10/2008.  Findings: Trachea is midline.  Heart size normal.  Thoracic aorta is calcified.  Biapical pleural thickening.  Lungs are clear.  No pleural fluid.  IMPRESSION: No acute findings.  Original Report Authenticated By: Reyes Ivan, M.D.   Ct Head Wo Contrast  12/10/2011  *RADIOLOGY REPORT*  Clinical Data: Weakness.  Confusion.  CT HEAD WITHOUT CONTRAST  Technique:  Contiguous axial images were obtained from the base of the skull through the vertex without contrast.  Comparison: None.  Findings: There is atrophy and chronic small vessel disease changes. No acute intracranial abnormality.  Specifically, no hemorrhage, hydrocephalus, mass lesion, acute infarction, or significant intracranial injury.  No acute calvarial abnormality. Visualized paranasal sinuses and mastoids clear.  Orbital soft tissues unremarkable.  IMPRESSION: No acute intracranial abnormality.  Atrophy, chronic microvascular disease.  Original Report Authenticated By: Cyndie Chime, M.D.    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 I have reviewed her peripheral smear as well which do show few teardrops and ovalocytes increased platelets and white cells. There is no left shift or immature forms. I discussed this with the patient and her sister. She may have an underlying myeloproliferative disorder which needs to be worked up and I recommend that we go ahead with a bone marrow biopsy. We also did obtain a JAK 2 analysis.  We will also  need to rule out an underlying CML phenotype. The patient's sister has confided in May the patient has been abusing alcohol for some time and as recently as February 8 had a blackout secondary to EtOH. Underlying alcohol use can certainly cause thrombocytosis as well. Stated I have suggested to the patient and her sister that she start taking low-dose aspirin. At some point we may want to consider her for anagrelide therapy to bring her platelet counts down. Final recommendations will be made once we have done the bone marrow biopsy.  60 minutes was spent with this patient at that time and patient-related counseling  Pierce Crane M.D. FRCP C. Date of service 12/16/11

## 2011-12-16 NOTE — Patient Instructions (Signed)
   YOUR BLOOD COUNTS ARE ELEVATED , THE PLATELET COUNTS HAVE BEEN ELEVATED SINCE 2008. THIS LIKELY REPRESENTS A BONE MARROW PROBLEM. WE WILL NEED TO DO A BONE MARROW TEST TO DETERMINE THE TYPE OF PROBLEM. WE WILL SCHEDULE THIS. i WOULD ADVISE YOU SEE A NEUROLOGIST AS WELL. WE CAN ARRANGE THIS. I WILL PUT YOU ON A PILL TO BRING YOUR PLATELET COUNT DOWN.

## 2011-12-16 NOTE — Telephone Encounter (Signed)
placed patient on dr.rubin per amy approval on 12-16-2011

## 2011-12-16 NOTE — Telephone Encounter (Signed)
gave patient appointment for follow up with dr.rubin called [facility] left message on voice mail with the new patient  person named Donna Webb

## 2011-12-23 ENCOUNTER — Other Ambulatory Visit (HOSPITAL_COMMUNITY)
Admission: RE | Admit: 2011-12-23 | Discharge: 2011-12-23 | Disposition: A | Discharge status: Home / Self Care | Payer: Medicare Other | Source: Ambulatory Visit | Attending: Oncology | Admitting: Oncology

## 2011-12-23 ENCOUNTER — Ambulatory Visit (HOSPITAL_BASED_OUTPATIENT_CLINIC_OR_DEPARTMENT_OTHER): Payer: Medicare Other | Admitting: Oncology

## 2011-12-23 ENCOUNTER — Ambulatory Visit: Payer: Medicare Other

## 2011-12-23 DIAGNOSIS — D473 Essential (hemorrhagic) thrombocythemia: Secondary | ICD-10-CM | POA: Insufficient documentation

## 2011-12-23 DIAGNOSIS — D72829 Elevated white blood cell count, unspecified: Secondary | ICD-10-CM | POA: Insufficient documentation

## 2011-12-23 DIAGNOSIS — D696 Thrombocytopenia, unspecified: Secondary | ICD-10-CM | POA: Insufficient documentation

## 2011-12-23 NOTE — Progress Notes (Unsigned)
BMBX procedure.  Pt. arrived with sister for BMBX today.  Written and verbal info. reviewed with pt. and consent obtained. Pt. denied any pain or unusual bleeding.  Per both pt. and sister, pt.has been improving slowly with memory and fatigue.  Dr. Donnie Coffin in and proceed with BMBX. Pt. Tolerated procedure well.  Dressing to lt hip remains dry and intact post.  Stable on discharged with sister. HL

## 2011-12-23 NOTE — Patient Instructions (Signed)
[facility] Discharge Instructions for Post Bone Marrow Procedure  Today you had a bone marrow biopsy and aspirate of right hip. Please keep the pressure dressing in place for at least 24 hours.  Have someone check your dressing periodically for bleeding.  If needed you can reapply a pressure dressing to the site.  Take pain medication as directed by Dr. Donnie Coffin.  IF BLEEDING REOCCURS THAT SHOULD BE REPORTED IMMEDIATELY. Call the Cancer Center at [phone] if during business hours. Or report to the Emergency Room.   I have been informed and understand all the instructions given to me. I know to contact the clinic, my physician, or go to the Emergency Department if any problems should occur. I do not have any questions at this time, but understand that I may call the clinic during office hours at [phone]  should I have any questions or need assistance in obtaining follow up care.    __________________________________________  _____________  __________ Signature of Patient or Authorized Representative            Date                   Time    __________________________________________ Nurse's Signature

## 2011-12-23 NOTE — Progress Notes (Signed)
Bone Marrow Biopsy and Aspiration Procedure Note   Informed consent was obtained and potential risks including bleeding, infection and pain were reviewed with the patient. A time out procedure was held.  Posterior iliac crest(s) prepped with Betadine.   Lidocaine 2% local anesthesia infiltrated into the subcutaneous tissue.  Left bone marrow biopsy and left bone marrow aspirate was obtained.   The procedure was tolerated well and there were no complications.  Specimens sent for: routine histopathologic stains and sectioning, flow cytometry and cytogenetics   Pierce Crane, MD [facility] Medical[facility]On[facility] [phone] (beeper) [phone] (Office)  12/23/2011, 9:15 AM

## 2011-12-29 ENCOUNTER — Telehealth: Payer: Self-pay | Admitting: Family Medicine

## 2011-12-29 ENCOUNTER — Other Ambulatory Visit: Payer: Self-pay | Admitting: Oncology

## 2011-12-29 NOTE — Telephone Encounter (Signed)
Called pt and sch ov for 01/05/12 as noted.

## 2011-12-29 NOTE — Telephone Encounter (Signed)
Please call pt and offer a office visit. Pt must discuss this with Dr. Clent Ridges.

## 2011-12-29 NOTE — Telephone Encounter (Signed)
Pt. Has some question as to why she is taking medications and which one she can stop talking. Just not sure if she needs to be on anything anymore.

## 2011-12-30 ENCOUNTER — Other Ambulatory Visit: Payer: Self-pay

## 2011-12-30 DIAGNOSIS — D471 Chronic myeloproliferative disease: Secondary | ICD-10-CM

## 2011-12-30 MED ORDER — HYDROXYUREA 500 MG PO CAPS: 500.0000 mg | ORAL_CAPSULE | Freq: Every day | ORAL | Status: DC

## 2011-12-31 ENCOUNTER — Ambulatory Visit (HOSPITAL_BASED_OUTPATIENT_CLINIC_OR_DEPARTMENT_OTHER): Payer: Medicare Other | Admitting: Oncology

## 2011-12-31 ENCOUNTER — Telehealth: Payer: Self-pay | Admitting: Oncology

## 2011-12-31 ENCOUNTER — Other Ambulatory Visit (HOSPITAL_BASED_OUTPATIENT_CLINIC_OR_DEPARTMENT_OTHER): Payer: Medicare Other | Admitting: Lab

## 2011-12-31 VITALS — BP 102/73 | HR 74 | Temp 97.5°F | Ht 59.5 in | Wt 107.3 lb

## 2011-12-31 DIAGNOSIS — D473 Essential (hemorrhagic) thrombocythemia: Secondary | ICD-10-CM

## 2011-12-31 DIAGNOSIS — D471 Chronic myeloproliferative disease: Secondary | ICD-10-CM

## 2011-12-31 DIAGNOSIS — D47Z9 Other specified neoplasms of uncertain behavior of lymphoid, hematopoietic and related tissue: Secondary | ICD-10-CM

## 2011-12-31 NOTE — Telephone Encounter (Signed)
gv pt/sister appt schedule for march/april.

## 2011-12-31 NOTE — Progress Notes (Signed)
Hematology and Oncology Follow Up Visit  Donna Webb [id number] 05/23/1941 71 y.o. 12/31/2011 9:49 AM PCP dr Donna Webb  Principle Diagnosis: Myeloproliferative disorder  Interim History:  There have been no intercurrent illness, hospitalizations or medication changes.  Medications: I have reviewed the patient's current medications.  Allergies:  Allergies  Allergen Reactions  . Amlodipine Besylate     REACTION: intolerance    Past Medical History, Surgical history, Social history, and Family History were reviewed and updated.  Review of Systems: Constitutional:  Negative for fever, chills, night sweats, anorexia, weight loss, pain., poor appetite and fatigue Cardiovascular: no chest pain or dyspnea on exertion Respiratory: no cough, shortness of breath, or wheezing Neurological: no TIA or stroke symptoms Dermatological: negative ENT: negative Skin Gastrointestinal: no abdominal pain, change in bowel habits, or black or bloody stools Genito-Urinary: negative Hematological and Lymphatic: negative Breast: negative Musculoskeletal: negative Remaining ROS negative.  Physical Exam: Blood pressure 102/73, pulse 74, temperature 97.5 F (36.4 C), height 4' 11.5" (1.511 m), weight 107 lb 4.8 oz (48.671 kg). ECOG:  General appearance: cooperative, appears stated age and distracted Head: Normocephalic, without obvious abnormality, atraumatic Neck: no adenopathy, no carotid bruit, no JVD, supple, symmetrical, trachea midline and thyroid not enlarged, symmetric, no tenderness/mass/nodules Lymph nodes: Cervical, supraclavicular, and axillary nodes normal. Cardiac : regular rate and rhythm, no murmurs or gallops Pulmonary:clear to auscultation bilaterally and normal percussion bilaterally Breasts: inspection negative, no nipple discharge or bleeding, no masses or nodularity palpable Abdomen:soft, non-tender; bowel sounds normal; no masses,  no organomegaly Extremities  negative Neuro: alert, oriented, normal speech, no focal findings or movement disorder noted  Lab Results: Lab Results  Component Value Date   WBC 16.7* 12/16/2011   HGB 16.2* 12/16/2011   HCT 46.8* 12/16/2011   MCV 88.6 12/16/2011   PLT 827* 12/16/2011     Chemistry      Component Value Date/Time   NA 139 12/16/2011 1013   K 4.7 12/16/2011 1013   CL 101 12/16/2011 1013   CO2 25 12/16/2011 1013   BUN 14 12/16/2011 1013   CREATININE 0.89 12/16/2011 1013      Component Value Date/Time   CALCIUM 11.4* 12/16/2011 1013   ALKPHOS 63 12/16/2011 1013   AST 45* 12/16/2011 1013   ALT 36* 12/16/2011 1013   BILITOT 0.6 12/16/2011 1013    Bone Marrow, Aspirate,Biopsy, and Clot, left PCIS - HYPERCELLULAR BONE MARROW FOR AGE WITH MEGAKARYOCYTIC PROLIFERATION. - SEE COMMENT. PERIPHERAL BLOOD: - LEUKOCYTOSIS WITH NEUTROPHILIA AND MILD LEFT SHIFT. - THROMBOCYTOSIS. 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 Correlation with JAK-2 mutation analysis and cytogenetic analysis is recommended. (BNS:gt, 12/24/11) Donna Bruin MD Pathologist, Electronic Signature (Case signed 12/25/2011) GROSS AND MICROSCOPIC INFORMATION Specimen Clinical Information Thrombocytosis. (lw) Source Bone Marrow, Aspirate,Biopsy, and Clot, left PCIS Microscopic LAB DATA: CBC performed on 12/16/2011 shows: WBC 16.7  .pathology. Radiological Studies: chest X-ray n/a Mammogram n/a Bone density n/a  Impression and Plan: This is a 71 year old woman with evidence of a myeloproliferative disorder based on the bone marrow biopsy was done. She does have a hypercellular marrow with megakaryocytic proliferation. I discussed implications of this with her and her sister Donna Webb. I have started her on Hydrea 500 mg a day and we will check counts weekly. She's also been started on low-dose aspirin. If her platelet counts do not come down she may need to be on anagrelide  as well. I have indicated  To her that this is  lifelong process. This can be preleukemic and eventually evolve into an acute leukemia though this may take many years. I will plan to see her in a month's time.  More than 50% of the visit was spent in patient-related counselling   Donna Crane, MD 3/6/20139:49 AM

## 2012-01-05 ENCOUNTER — Ambulatory Visit (INDEPENDENT_AMBULATORY_CARE_PROVIDER_SITE_OTHER): Payer: Medicare Other | Admitting: Family Medicine

## 2012-01-05 ENCOUNTER — Encounter: Payer: Self-pay | Admitting: Family Medicine

## 2012-01-05 VITALS — BP 116/78 | HR 111 | Temp 98.2°F | Wt 108.0 lb

## 2012-01-05 DIAGNOSIS — I10 Essential (primary) hypertension: Secondary | ICD-10-CM

## 2012-01-05 DIAGNOSIS — R413 Other amnesia: Secondary | ICD-10-CM

## 2012-01-05 LAB — JAK2 GENOTYPR

## 2012-01-05 NOTE — Progress Notes (Signed)
  Subjective:    Patient ID: Donna Webb, female    DOB: 1941/08/25, 71 y.o.   MRN: [id number]  HPI Here to follow up on HTN and confusion. Her BP has been quite stable. She has been seeing Dr. Donnie Coffin for her elevated cell counts, and her bone marrow biopsy suggests a myeloproliferative disorder. This is probably an early precursor to leukemia, and he wants to see her on a regular basis. She is still confused at times, and her sister [name] is here with her. Her sister does all the driving now. They ask if this could be early dementia and if she should see a Neurologist. Her sister is also very anxious to have Evanne sign a Living Will and to establish her sister as her Durable POA. Pebbles agrees with this.   Review of Systems  Constitutional: Negative.   Respiratory: Negative.   Cardiovascular: Negative.   Neurological: Negative.   Psychiatric/Behavioral: Negative.        Objective:   Physical Exam  Constitutional: She is oriented to person, place, and time. She appears well-developed and well-nourished.  Cardiovascular: Normal rate, regular rhythm, normal heart sounds and intact distal pulses.   Pulmonary/Chest: Effort normal and breath sounds normal.  Neurological: She is alert and oriented to person, place, and time.       She does get confused easily during a conversation, and we have to finish sentences for her often because she cannot remember the words she wants to say   Psychiatric: She has a normal mood and affect. Her behavior is normal.          Assessment & Plan:  Her HTN is stable. She does seem to show some early signs of dementia. I agree with seeing a Neurologist, but I think it would serve her well to get these papers signed (Living Will and Durable POA) now before there is any question about her competence to make decisions. After that we can pursue a neurologic evaluation. They will see her attorney ASAP.

## 2012-01-07 ENCOUNTER — Other Ambulatory Visit (HOSPITAL_BASED_OUTPATIENT_CLINIC_OR_DEPARTMENT_OTHER): Payer: Medicare Other | Admitting: Lab

## 2012-01-07 DIAGNOSIS — D47Z9 Other specified neoplasms of uncertain behavior of lymphoid, hematopoietic and related tissue: Secondary | ICD-10-CM

## 2012-01-07 DIAGNOSIS — D471 Chronic myeloproliferative disease: Secondary | ICD-10-CM

## 2012-01-07 LAB — CBC WITH DIFFERENTIAL/PLATELET
BASO%: 0.8 % (ref 0.0–2.0)
Basophils Absolute: 0.1 10*3/uL (ref 0.0–0.1)
Eosinophils Absolute: 0.5 10*3/uL (ref 0.0–0.5)
HCT: 48.9 % — ABNORMAL HIGH (ref 34.8–46.6)
HGB: 16.7 g/dL — ABNORMAL HIGH (ref 11.6–15.9)
LYMPH%: 20.9 % (ref 14.0–49.7)
MONO#: 1 10*3/uL — ABNORMAL HIGH (ref 0.1–0.9)
NEUT#: 9.5 10*3/uL — ABNORMAL HIGH (ref 1.5–6.5)
NEUT%: 67.9 % (ref 38.4–76.8)
Platelets: 961 10*3/uL — ABNORMAL HIGH (ref 145–400)
WBC: 14 10*3/uL — ABNORMAL HIGH (ref 3.9–10.3)
lymph#: 2.9 10*3/uL (ref 0.9–3.3)

## 2012-01-14 ENCOUNTER — Other Ambulatory Visit (HOSPITAL_BASED_OUTPATIENT_CLINIC_OR_DEPARTMENT_OTHER): Payer: Medicare Other | Admitting: Lab

## 2012-01-14 DIAGNOSIS — D471 Chronic myeloproliferative disease: Secondary | ICD-10-CM

## 2012-01-14 DIAGNOSIS — D696 Thrombocytopenia, unspecified: Secondary | ICD-10-CM

## 2012-01-14 DIAGNOSIS — D47Z9 Other specified neoplasms of uncertain behavior of lymphoid, hematopoietic and related tissue: Secondary | ICD-10-CM

## 2012-01-14 LAB — CBC WITH DIFFERENTIAL/PLATELET
Basophils Absolute: 0.1 10*3/uL (ref 0.0–0.1)
EOS%: 2.7 % (ref 0.0–7.0)
Eosinophils Absolute: 0.3 10*3/uL (ref 0.0–0.5)
HCT: 48 % — ABNORMAL HIGH (ref 34.8–46.6)
HGB: 16 g/dL — ABNORMAL HIGH (ref 11.6–15.9)
MCH: 30.7 pg (ref 25.1–34.0)
MCV: 92.2 fL (ref 79.5–101.0)
MONO%: 3.5 % (ref 0.0–14.0)
NEUT#: 10.1 10*3/uL — ABNORMAL HIGH (ref 1.5–6.5)
NEUT%: 76.6 % (ref 38.4–76.8)
RDW: 14 % (ref 11.2–14.5)

## 2012-01-17 ENCOUNTER — Other Ambulatory Visit: Payer: Self-pay | Admitting: Family Medicine

## 2012-01-21 ENCOUNTER — Other Ambulatory Visit (HOSPITAL_BASED_OUTPATIENT_CLINIC_OR_DEPARTMENT_OTHER): Payer: Medicare Other | Admitting: Lab

## 2012-01-21 DIAGNOSIS — D471 Chronic myeloproliferative disease: Secondary | ICD-10-CM

## 2012-01-21 DIAGNOSIS — D47Z9 Other specified neoplasms of uncertain behavior of lymphoid, hematopoietic and related tissue: Secondary | ICD-10-CM

## 2012-01-21 LAB — CBC WITH DIFFERENTIAL/PLATELET
Basophils Absolute: 0 10*3/uL (ref 0.0–0.1)
EOS%: 0.2 % (ref 0.0–7.0)
Eosinophils Absolute: 0 10*3/uL (ref 0.0–0.5)
HGB: 14.5 g/dL (ref 11.6–15.9)
LYMPH%: 12.5 % — ABNORMAL LOW (ref 14.0–49.7)
MCH: 30.6 pg (ref 25.1–34.0)
MCV: 91 fL (ref 79.5–101.0)
MONO%: 3.6 % (ref 0.0–14.0)
NEUT#: 6.7 10*3/uL — ABNORMAL HIGH (ref 1.5–6.5)
Platelets: 442 10*3/uL — ABNORMAL HIGH (ref 145–400)

## 2012-01-27 ENCOUNTER — Other Ambulatory Visit: Payer: Self-pay | Admitting: Physician Assistant

## 2012-01-27 DIAGNOSIS — D471 Chronic myeloproliferative disease: Secondary | ICD-10-CM

## 2012-01-28 ENCOUNTER — Other Ambulatory Visit (HOSPITAL_BASED_OUTPATIENT_CLINIC_OR_DEPARTMENT_OTHER): Payer: Medicare Other | Admitting: Lab

## 2012-01-28 ENCOUNTER — Telehealth: Payer: Self-pay | Admitting: *Deleted

## 2012-01-28 ENCOUNTER — Ambulatory Visit (HOSPITAL_BASED_OUTPATIENT_CLINIC_OR_DEPARTMENT_OTHER): Payer: Medicare Other | Admitting: Physician Assistant

## 2012-01-28 VITALS — BP 136/82 | HR 77 | Temp 97.8°F | Ht 59.5 in | Wt 106.1 lb

## 2012-01-28 DIAGNOSIS — D471 Chronic myeloproliferative disease: Secondary | ICD-10-CM

## 2012-01-28 DIAGNOSIS — D47Z9 Other specified neoplasms of uncertain behavior of lymphoid, hematopoietic and related tissue: Secondary | ICD-10-CM

## 2012-01-28 LAB — CBC WITH DIFFERENTIAL/PLATELET
BASO%: 0.5 % (ref 0.0–2.0)
EOS%: 2.5 % (ref 0.0–7.0)
Eosinophils Absolute: 0.4 10*3/uL (ref 0.0–0.5)
HCT: 40.6 % (ref 34.8–46.6)
LYMPH%: 7.2 % — ABNORMAL LOW (ref 14.0–49.7)
MCH: 30.4 pg (ref 25.1–34.0)
MCHC: 33.9 g/dL (ref 31.5–36.0)
MCV: 89.6 fL (ref 79.5–101.0)
MONO%: 9.4 % (ref 0.0–14.0)
NEUT#: 12.1 10*3/uL — ABNORMAL HIGH (ref 1.5–6.5)
NEUT%: 80.4 % — ABNORMAL HIGH (ref 38.4–76.8)
Platelets: 835 10*3/uL — ABNORMAL HIGH (ref 145–400)
RBC: 4.53 10*6/uL (ref 3.70–5.45)
RDW: 15.2 % — ABNORMAL HIGH (ref 11.2–14.5)
nRBC: 0 % (ref 0–0)

## 2012-01-28 NOTE — Patient Instructions (Signed)
1. Increase Hydrea to 2 (two) capsule per day starting today.  2. We will recheck labs weekly for the next 4 weeks.  3. We will see you every other week.

## 2012-01-28 NOTE — Telephone Encounter (Signed)
gave patient appointment for 02-11-2012 added on midlevel appointment 02-25-2012 added on midlevel appoitment printed out calendar and gave to the patient

## 2012-01-29 NOTE — Progress Notes (Signed)
Hematology and Oncology Follow Up Visit  Donna Webb [id number] 1941/09/14 71 y.o. 01/29/2012    HPI: Patient is a 72 year old [place] woman with a newly diagnosed myeloproliferative disorder currently on Hydrea 500 mg per day. 2. Hypertension managed by Dr. Gershon Crane 3. Progressive confusion/suggesting dementia assessment ongoing.  Interim History:   Donna Webb is seen today with her sister in accompaniment for followup of her myeloproliferative disorder. She is on hydroxyurea 500 mg per day. CBCs have been checked weekly, and her last week's platelet count had dropped nicely into the 400,000 range. Her sister does state that she is becoming more confused. She does state that her living Will has been found. She is still in the process of obtaining her sister is durable power of attorney.  There has been no apparent difficulty with fevers, chills or night sweats. No nausea, or emesis issues. Her appetite is a bit off. No bleeding or bruising symptoms.  A detailed review of systems is otherwise noncontributory as noted below.  Review of Systems: Constitutional:  no weight loss, fever, night sweats and feels well Eyes: no complaints ENT: no complaints Cardiovascular: negative Respiratory: negative Neurological: negative Dermatological: negative Gastrointestinal: negative Genito-Urinary: negative Hematological and Lymphatic: negative Breast: negative Musculoskeletal: negative Remaining ROS negative.   Medications:   I have reviewed the patient's current medications.  Current Outpatient Prescriptions  Medication Sig Dispense Refill  . aspirin 81 MG tablet Take 81 mg by mouth daily.      Marland Kitchen atenolol (TENORMIN) 50 MG tablet TAKE 1 TABLET TWO TIMES A DAY  180 tablet  3  . Calcium Carbonate-Vitamin D (CALCIUM-VITAMIN D) 500-200 MG-UNIT per tablet Take 1 tablet by mouth 2 (two) times daily with meals.        . cloNIDine (CATAPRES) 0.2 MG tablet TAKE 1 TABLET TWO  TIMES A DAY  180 tablet  3  . hydroxyurea (HYDREA) 500 MG capsule Take 1 capsule (500 mg total) by mouth daily. May take with food to minimize GI side effects.  30 capsule  3  . lisinopril (PRINIVIL,ZESTRIL) 20 MG tablet TAKE 1 TABLET DAILY  90 tablet  3  . Multiple Vitamin (MULTIVITAMIN) tablet Take 1 tablet by mouth daily.          Allergies:  Allergies  Allergen Reactions  . Amlodipine Besylate     REACTION: intolerance    Physical Exam: Filed Vitals:   01/28/12 0930  BP: 136/82  Pulse: 77  Temp: 97.8 F (36.6 C)    Body mass index is 21.07 kg/(m^2). HEENT:  Sclerae anicteric, conjunctivae pink.  Oropharynx clear.  No mucositis or candidiasis.   Nodes:  No cervical, supraclavicular, or axillary lymphadenopathy palpated.  Lungs:  Clear to auscultation bilaterally.  No crackles, rhonchi, or wheezes.   Heart:  Regular rate and rhythm.   Abdomen:  Soft, nontender.  Positive bowel sounds.  No organomegaly or masses palpated.   Musculoskeletal:  No focal spinal tenderness to palpation, but no evidence of upper thoracic scoliosis. Extremities:  Benign.  No peripheral edema or cyanosis.   Skin:  Benign.     Lab Results: Lab Results  Component Value Date   WBC 15.0* 01/28/2012   HGB 13.8 01/28/2012   HCT 40.6 01/28/2012   MCV 89.6 01/28/2012   PLT 835* 01/28/2012   NEUTROABS 12.1* 01/28/2012     Chemistry      Component Value Date/Time   NA 139 12/16/2011 1013   K 4.7 12/16/2011 1013  CL 101 12/16/2011 1013   CO2 25 12/16/2011 1013   BUN 14 12/16/2011 1013   CREATININE 0.89 12/16/2011 1013      Component Value Date/Time   CALCIUM 11.4* 12/16/2011 1013   ALKPHOS 63 12/16/2011 1013   AST 45* 12/16/2011 1013   ALT 36* 12/16/2011 1013   BILITOT 0.6 12/16/2011 1013      No results found for this basename: LABCA2     Assessment:  Patient is a 71 year old [place] woman with a newly diagnosed myeloproliferative disorder currently on Hydrea 500 mg per day. Today CBC  revealing evidence of increased platelet count back to 800,000 range and increase in WBC to 15,000 range. 2. Hypertension managed by Dr. Gershon Crane 3. Progressive confusion/suggesting dementia assessment ongoing.  Case reviewed with Dr. Pierce Crane.  Plan:  Donna Webb has agree to increase her Hydrea to 1000 mg per day over the next week. We will consider anagrelide. At this point, Donna Webb to continue with therapy, but she may change her mind if she feels it is affecting her quality of life, therefore we will follow her quite closely, her sister understands and agrees with this plan.   Donna Reali T, PA-C 01/28/12

## 2012-02-04 ENCOUNTER — Other Ambulatory Visit (HOSPITAL_BASED_OUTPATIENT_CLINIC_OR_DEPARTMENT_OTHER): Payer: Medicare Other | Admitting: Lab

## 2012-02-04 DIAGNOSIS — D471 Chronic myeloproliferative disease: Secondary | ICD-10-CM

## 2012-02-04 DIAGNOSIS — D47Z9 Other specified neoplasms of uncertain behavior of lymphoid, hematopoietic and related tissue: Secondary | ICD-10-CM

## 2012-02-04 LAB — CBC WITH DIFFERENTIAL/PLATELET
Basophils Absolute: 0.2 10*3/uL — ABNORMAL HIGH (ref 0.0–0.1)
EOS%: 1 % (ref 0.0–7.0)
HCT: 44 % (ref 34.8–46.6)
HGB: 14.7 g/dL (ref 11.6–15.9)
MCH: 30.3 pg (ref 25.1–34.0)
MCV: 90.8 fL (ref 79.5–101.0)
MONO%: 9.3 % (ref 0.0–14.0)
NEUT%: 73 % (ref 38.4–76.8)
Platelets: 697 10*3/uL — ABNORMAL HIGH (ref 145–400)

## 2012-02-10 ENCOUNTER — Telehealth: Payer: Self-pay | Admitting: Family Medicine

## 2012-02-10 NOTE — Telephone Encounter (Signed)
Patient's sister called stating that she found her sister's living will and she is trying to get the power of attorney done and the Lawyer [employer] firm would like a one sentence letter stating:  Donna Webb would understand a document of POA and comprehend what a POA means. State if you feel comfortable with Donna Webb signing a POA and sign. Please advise.

## 2012-02-10 NOTE — Telephone Encounter (Signed)
Patient's sister would like to be called back at [phone].

## 2012-02-11 ENCOUNTER — Ambulatory Visit: Payer: Medicare Other | Admitting: Physician Assistant

## 2012-02-11 ENCOUNTER — Other Ambulatory Visit: Payer: Medicare Other | Admitting: Lab

## 2012-02-11 NOTE — Telephone Encounter (Signed)
This was done.

## 2012-02-11 NOTE — Telephone Encounter (Signed)
Left voice message, note is ready for pick up. 

## 2012-02-18 ENCOUNTER — Other Ambulatory Visit: Payer: Medicare Other | Admitting: Lab

## 2012-02-25 ENCOUNTER — Other Ambulatory Visit: Payer: Medicare Other | Admitting: Lab

## 2012-02-25 ENCOUNTER — Ambulatory Visit: Payer: Medicare Other | Admitting: Physician Assistant

## 2012-02-25 ENCOUNTER — Telehealth: Payer: Self-pay | Admitting: *Deleted

## 2012-02-25 NOTE — Telephone Encounter (Signed)
gave patient appointment for 04-06-2012 starting at 10:45am printed out calendar and gave to the patient

## 2012-02-25 NOTE — Progress Notes (Signed)
POF sent to schedulers due to [facility] 

## 2012-03-04 ENCOUNTER — Telehealth: Payer: Self-pay

## 2012-03-04 ENCOUNTER — Encounter: Payer: Self-pay | Admitting: Family Medicine

## 2012-03-04 ENCOUNTER — Ambulatory Visit (INDEPENDENT_AMBULATORY_CARE_PROVIDER_SITE_OTHER): Payer: Medicare Other | Admitting: Family Medicine

## 2012-03-04 VITALS — BP 112/70 | HR 76 | Temp 98.4°F | Wt 101.0 lb

## 2012-03-04 DIAGNOSIS — D471 Chronic myeloproliferative disease: Secondary | ICD-10-CM | POA: Insufficient documentation

## 2012-03-04 DIAGNOSIS — D47Z9 Other specified neoplasms of uncertain behavior of lymphoid, hematopoietic and related tissue: Secondary | ICD-10-CM

## 2012-03-04 DIAGNOSIS — F0390 Unspecified dementia without behavioral disturbance: Secondary | ICD-10-CM

## 2012-03-04 DIAGNOSIS — I10 Essential (primary) hypertension: Secondary | ICD-10-CM

## 2012-03-04 MED ORDER — DONEPEZIL HCL 5 MG PO TABS: 5.0000 mg | ORAL_TABLET | Freq: Every evening | ORAL | Status: DC | PRN

## 2012-03-04 MED ORDER — LORAZEPAM 0.5 MG PO TABS: 0.5000 mg | ORAL_TABLET | Freq: Four times a day (QID) | ORAL | Status: AC | PRN

## 2012-03-04 NOTE — Telephone Encounter (Signed)
Received message from pt's sister, stating that the pt does not wish to return here for any more labs.  Called her number back [phone] and left message on unidentified vm for her to call office back.  Will make Debbora Presto, PA, aware.

## 2012-03-04 NOTE — Progress Notes (Signed)
  Subjective:    Patient ID: Donna Webb, female    DOB: Jun 08, 1941, 71 y.o.   MRN: [id number]  HPI Here with her sister, Donna Webb (cell [phone] and home [phone]), who has recently been designated as [facility] Power of [address]. [name] had been seeing Dr. Donnie Coffin at the Hematology clinic for myelodysplastic sydrome with elevated WBC counts and platelet counts. They had started Kaylean on Hydroxyurea for this, and she did well on 500 mg a day for a short time. Then her cell counts started going up again, and they increased the Hydroxyurea to a total of 1000 mg a day. After taking this higher dose for only 2 days, [name] developed numerous side effects including chills, nausea, lethargy, fatigue, confusion, HA, and loss of appetite. She has been dealing with some dementia for the past year or so, and these symptoms were much worse during these 2 days. She then stopped taking Hydroxyurea altogether, and she quickly got back to her baseline. She has been feeling fine lately, although she still deals with some daily confusion and memory problems. She is more alert, and her appetite has returned. She now says that she will never take any medication for her myelodysplastic syndrome, and she will never return to the Hematology clinic. Her sister wants to respect these wishes, and asks for me to respect them as well.  She does ask if there is any medication that she can give to [facility] on a prn basis for anxiety. At times she can suddenly become very agitated and angry, and these episodes can last for several hours at a time. Also they both ask if we can try something to improve her memory. She sleeps fairly well. She eats well and has gained a little weight.    Review of Systems  Constitutional: Negative.   Respiratory: Negative.   Cardiovascular: Negative.   Gastrointestinal: Negative.   Neurological: Positive for speech difficulty and light-headedness. Negative for dizziness, tremors, seizures,  syncope, facial asymmetry, weakness, numbness and headaches.  Psychiatric/Behavioral: Positive for behavioral problems, confusion and agitation. Negative for hallucinations and dysphoric mood. The patient is nervous/anxious. The patient is not hyperactive.        Objective:   Physical Exam  Constitutional: She appears well-developed and well-nourished.  Neck: No thyromegaly present.  Cardiovascular: Normal rate, regular rhythm, normal heart sounds and intact distal pulses.   Pulmonary/Chest: Effort normal and breath sounds normal.  Lymphadenopathy:    She has no cervical adenopathy.  Neurological: She is alert. She has normal reflexes. No cranial nerve deficit. She exhibits normal muscle tone. Coordination normal.       Oriented to self and place, not to date. She often stops mid sentence when speaking because she forgets what she is going to say. Often has to search for words  Psychiatric: She has a normal mood and affect. Her behavior is normal. Thought content normal.          Assessment & Plan:  As for the myelodysplastic syndrome, I disagree with her decision to stop going to the Hematology clinic, but I will respect her wishes on this. We will refrain from any type of therapy for this. We agreed to follow up with me in one month, and we will check another CBC at that time. We will start her on Aricept 5 mg a day for the dementia, and plan to advance this to 10 mg if possible. Try Lorazepam for prn anxiety.

## 2012-03-05 ENCOUNTER — Encounter: Payer: Self-pay | Admitting: *Deleted

## 2012-03-05 ENCOUNTER — Telehealth: Payer: Self-pay | Admitting: *Deleted

## 2012-03-05 NOTE — Progress Notes (Unsigned)
rec'd call from sister Rosey Bath who reports that pt refuses  All future treatments in this office. Scheduling has been notified to cancel all appts.

## 2012-03-05 NOTE — Telephone Encounter (Signed)
per conversation with nurse on 03-05-2012 cancelled patient's up coming appointments

## 2012-03-15 ENCOUNTER — Other Ambulatory Visit: Payer: Self-pay | Admitting: Family Medicine

## 2012-03-15 DIAGNOSIS — Z1231 Encounter for screening mammogram for malignant neoplasm of breast: Secondary | ICD-10-CM

## 2012-04-05 ENCOUNTER — Encounter: Payer: Self-pay | Admitting: Family Medicine

## 2012-04-05 ENCOUNTER — Ambulatory Visit (INDEPENDENT_AMBULATORY_CARE_PROVIDER_SITE_OTHER): Payer: Medicare Other | Admitting: Family Medicine

## 2012-04-05 VITALS — BP 110/74 | HR 70 | Temp 98.1°F | Wt 104.0 lb

## 2012-04-05 DIAGNOSIS — F419 Anxiety disorder, unspecified: Secondary | ICD-10-CM

## 2012-04-05 DIAGNOSIS — I10 Essential (primary) hypertension: Secondary | ICD-10-CM

## 2012-04-05 DIAGNOSIS — F0390 Unspecified dementia without behavioral disturbance: Secondary | ICD-10-CM

## 2012-04-05 DIAGNOSIS — F411 Generalized anxiety disorder: Secondary | ICD-10-CM

## 2012-04-05 NOTE — Progress Notes (Signed)
  Subjective:    Patient ID: Donna Webb, female    DOB: Mar 14, 1941, 71 y.o.   MRN: [id number]  HPI Here with her sister to follow up on dementia, primarily. She has been on Aricept 5 mg a day, and she has made a dramatic turnaround. She feels better, her memory has improved, and her old personality has returned. She has her old sense of humor and her quick wit back. She is eating better and has put on 3 lbs.    Review of Systems  Constitutional: Negative.   Respiratory: Negative.   Cardiovascular: Negative.   Neurological: Negative.   Psychiatric/Behavioral: Negative.        Objective:   Physical Exam  Constitutional: She is oriented to person, place, and time. She appears well-developed and well-nourished.  Cardiovascular: Normal rate, regular rhythm, normal heart sounds and intact distal pulses.   Pulmonary/Chest: Effort normal and breath sounds normal.  Neurological: She is alert and oriented to person, place, and time.  Psychiatric: She has a normal mood and affect. Her behavior is normal. Judgment and thought content normal.          Assessment & Plan:  She seems to be back to her old self. We will stay on her current meds. Recheck in 3 months, and we will get a CBC at that time.

## 2012-04-06 ENCOUNTER — Other Ambulatory Visit: Payer: Medicare Other | Admitting: Lab

## 2012-04-06 ENCOUNTER — Ambulatory Visit: Payer: Medicare Other | Admitting: Physician Assistant

## 2012-04-12 ENCOUNTER — Ambulatory Visit (HOSPITAL_COMMUNITY)
Admission: RE | Admit: 2012-04-12 | Discharge: 2012-04-12 | Disposition: A | Discharge status: Home / Self Care | Payer: Medicare Other | Source: Ambulatory Visit | Attending: Family Medicine | Admitting: Family Medicine

## 2012-04-12 DIAGNOSIS — Z1231 Encounter for screening mammogram for malignant neoplasm of breast: Secondary | ICD-10-CM | POA: Insufficient documentation

## 2012-08-19 ENCOUNTER — Encounter: Payer: Self-pay | Admitting: Family Medicine

## 2012-08-19 ENCOUNTER — Ambulatory Visit (INDEPENDENT_AMBULATORY_CARE_PROVIDER_SITE_OTHER): Payer: Medicare Other | Admitting: Family Medicine

## 2012-08-19 VITALS — BP 108/72 | HR 53 | Temp 97.9°F | Wt 113.0 lb

## 2012-08-19 DIAGNOSIS — D471 Chronic myeloproliferative disease: Secondary | ICD-10-CM

## 2012-08-19 DIAGNOSIS — Z23 Encounter for immunization: Secondary | ICD-10-CM

## 2012-08-19 DIAGNOSIS — I10 Essential (primary) hypertension: Secondary | ICD-10-CM

## 2012-08-19 DIAGNOSIS — F0390 Unspecified dementia without behavioral disturbance: Secondary | ICD-10-CM

## 2012-08-19 DIAGNOSIS — D47Z9 Other specified neoplasms of uncertain behavior of lymphoid, hematopoietic and related tissue: Secondary | ICD-10-CM

## 2012-08-19 LAB — CBC WITH DIFFERENTIAL/PLATELET
Basophils Absolute: 0.1 10*3/uL (ref 0.0–0.1)
Eosinophils Absolute: 0.6 10*3/uL (ref 0.0–0.7)
Hemoglobin: 15.5 g/dL — ABNORMAL HIGH (ref 12.0–15.0)
Lymphocytes Relative: 22.1 % (ref 12.0–46.0)
Lymphs Abs: 3.9 10*3/uL (ref 0.7–4.0)
MCHC: 31.9 g/dL (ref 30.0–36.0)
MCV: 84.8 fl (ref 78.0–100.0)
Monocytes Absolute: 1.1 10*3/uL — ABNORMAL HIGH (ref 0.1–1.0)
Neutro Abs: 11.8 10*3/uL — ABNORMAL HIGH (ref 1.4–7.7)
RDW: 13.5 % (ref 11.5–14.6)

## 2012-08-19 MED ORDER — DONEPEZIL HCL 5 MG PO TABS: 5.0000 mg | ORAL_TABLET | Freq: Every evening | ORAL | Status: DC | PRN

## 2012-08-19 NOTE — Progress Notes (Signed)
  Subjective:    Patient ID: Donna Webb, female    DOB: 11/14/1940, 71 y.o.   MRN: [id number]  HPI Here with her sister to follow up. She is doing quite well, and both of them agree on this. She is eating well and has put on 9 lbs since our last visit. Her moods are good. She goes for walks every day. Her memory has improved dramatically on Aricept.    Review of Systems  Constitutional: Negative.   Respiratory: Negative.   Cardiovascular: Negative.   Neurological: Negative.   Psychiatric/Behavioral: Negative.        Objective:   Physical Exam  Constitutional: She is oriented to person, place, and time. She appears well-developed and well-nourished.  Cardiovascular: Normal rate, regular rhythm, normal heart sounds and intact distal pulses.   Pulmonary/Chest: Effort normal and breath sounds normal.  Neurological: She is alert and oriented to person, place, and time.  Psychiatric: She has a normal mood and affect. Her behavior is normal. Thought content normal.          Assessment & Plan:  Her dementia is stable. HTN is stable. We will get a CBC today to follow her cell counts.

## 2012-08-22 ENCOUNTER — Other Ambulatory Visit: Payer: Self-pay | Admitting: Family Medicine

## 2012-09-07 ENCOUNTER — Ambulatory Visit (INDEPENDENT_AMBULATORY_CARE_PROVIDER_SITE_OTHER): Payer: Medicare Other | Admitting: Family Medicine

## 2012-09-07 ENCOUNTER — Encounter: Payer: Self-pay | Admitting: Family Medicine

## 2012-09-07 VITALS — BP 110/72 | HR 81 | Temp 98.3°F | Wt 110.0 lb

## 2012-09-07 DIAGNOSIS — D471 Chronic myeloproliferative disease: Secondary | ICD-10-CM

## 2012-09-07 DIAGNOSIS — I10 Essential (primary) hypertension: Secondary | ICD-10-CM

## 2012-09-07 DIAGNOSIS — Z0000 Encounter for general adult medical examination without abnormal findings: Secondary | ICD-10-CM

## 2012-09-07 DIAGNOSIS — D47Z9 Other specified neoplasms of uncertain behavior of lymphoid, hematopoietic and related tissue: Secondary | ICD-10-CM

## 2012-09-07 DIAGNOSIS — F0390 Unspecified dementia without behavioral disturbance: Secondary | ICD-10-CM

## 2012-09-07 DIAGNOSIS — Z23 Encounter for immunization: Secondary | ICD-10-CM

## 2012-09-07 NOTE — Progress Notes (Signed)
  Subjective:    Patient ID: Donna Webb, female    DOB: 31-Dec-1940, 71 y.o.   MRN: [id number]  HPI Here for a Pneumovax and to discuss her recent lab results. When she was here a few weeks ago we got a CBC which showed her WBC had jumped up to 17 and her platelets were over 1300. I recommended she see Dr. Donnie Coffin again but she disagreed. She feels well in general.    Review of Systems  Constitutional: Negative.   Respiratory: Negative.   Cardiovascular: Negative.        Objective:   Physical Exam  Constitutional: She appears well-developed and well-nourished.  Cardiovascular: Normal rate, regular rhythm, normal heart sounds and intact distal pulses.   Pulmonary/Chest: Effort normal and breath sounds normal.          Assessment & Plan:  I had a discussion with her about the dangers of not treating her myeloproliferative disorder, and I convinced her to see Dr. Donnie Coffin again. They will call for an appt soon. Given the Pneumovax.

## 2012-09-07 NOTE — Addendum Note (Signed)
Addended by: Aniceto Boss A on: 09/07/2012 12:09 PM   Modules accepted: Orders

## 2012-09-08 ENCOUNTER — Other Ambulatory Visit: Payer: Self-pay | Admitting: *Deleted

## 2012-09-08 DIAGNOSIS — D473 Essential (hemorrhagic) thrombocythemia: Secondary | ICD-10-CM

## 2012-09-08 MED ORDER — ANAGRELIDE HCL 0.5 MG PO CAPS: 0.5000 mg | ORAL_CAPSULE | Freq: Two times a day (BID) | ORAL | Status: DC

## 2012-09-09 ENCOUNTER — Telehealth: Payer: Self-pay | Admitting: *Deleted

## 2012-09-09 NOTE — Telephone Encounter (Signed)
Donna Webb confirmed over the phone the new date and time of the lab only appointment 10-08-2012 at 8:15am

## 2012-09-27 ENCOUNTER — Other Ambulatory Visit: Payer: Self-pay | Admitting: Family Medicine

## 2012-10-08 ENCOUNTER — Other Ambulatory Visit (HOSPITAL_BASED_OUTPATIENT_CLINIC_OR_DEPARTMENT_OTHER): Payer: Medicare Other

## 2012-10-08 ENCOUNTER — Other Ambulatory Visit: Payer: Self-pay | Admitting: *Deleted

## 2012-10-08 DIAGNOSIS — D47Z9 Other specified neoplasms of uncertain behavior of lymphoid, hematopoietic and related tissue: Secondary | ICD-10-CM

## 2012-10-08 DIAGNOSIS — D471 Chronic myeloproliferative disease: Secondary | ICD-10-CM

## 2012-10-08 LAB — CBC WITH DIFFERENTIAL/PLATELET
BASO%: 0.9 % (ref 0.0–2.0)
EOS%: 3.4 % (ref 0.0–7.0)
HGB: 15.7 g/dL (ref 11.6–15.9)
MCH: 25.8 pg (ref 25.1–34.0)
MCHC: 32.1 g/dL (ref 31.5–36.0)
MONO%: 5.7 % (ref 0.0–14.0)
RBC: 6.09 10*6/uL — ABNORMAL HIGH (ref 3.70–5.45)
RDW: 15.1 % — ABNORMAL HIGH (ref 11.2–14.5)
lymph#: 2.7 10*3/uL (ref 0.9–3.3)

## 2012-10-11 ENCOUNTER — Other Ambulatory Visit: Payer: Self-pay | Admitting: *Deleted

## 2012-10-11 NOTE — Telephone Encounter (Signed)
Message left for pt to return call to this RN to verify current dose of anagrelide for appropriate refill.

## 2012-10-12 ENCOUNTER — Other Ambulatory Visit: Payer: Self-pay | Admitting: *Deleted

## 2012-10-12 ENCOUNTER — Telehealth: Payer: Self-pay | Admitting: *Deleted

## 2012-10-12 DIAGNOSIS — D473 Essential (hemorrhagic) thrombocythemia: Secondary | ICD-10-CM

## 2012-10-12 NOTE — Telephone Encounter (Signed)
This RN spoke with pt's sister, Rosey Bath, who is her HCPOA about lab value and to verify current dose of medication. Rosey Bath states pt is on anagrelide 0.5mg  twice a day and is tolerated the medication well.  Per review pt will continue on current dose and labs will be checked on a monthly basis.

## 2012-10-13 ENCOUNTER — Telehealth: Payer: Self-pay | Admitting: Oncology

## 2012-10-13 NOTE — Telephone Encounter (Signed)
LMONVM OF THE PT'S SISTER ADVISING HER OF THE LAB APPT IN JAN AND TO PICK UP THE SCHEDULES AT THAT TIME

## 2012-11-03 IMAGING — CT CT HEAD W/O CM
2 series · 16 of 30 positions shown, 20 images · non-contrast
Comparison: None.

CLINICAL DATA: Weakness.  Confusion.

CT HEAD WITHOUT CONTRAST
TECHNIQUE: Contiguous axial images were obtained from the base of
the skull through the vertex without contrast.

[Series 2: head w/o · axial · non-contrast · 0.43mm/px · z∈[+25,+145]mm · 13 of 30 slices shown, 17 images]
[im 3/30  brain]
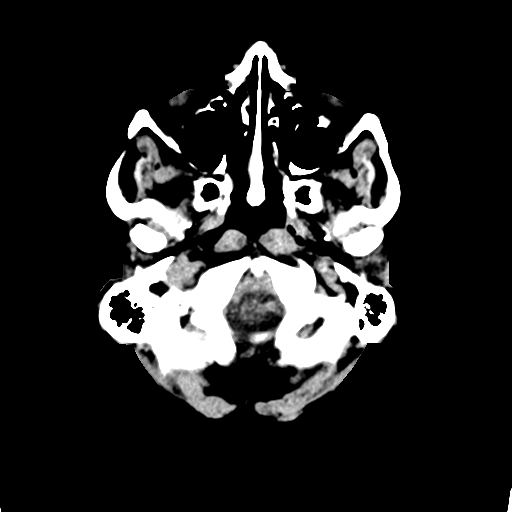
[im 3/30  bone]
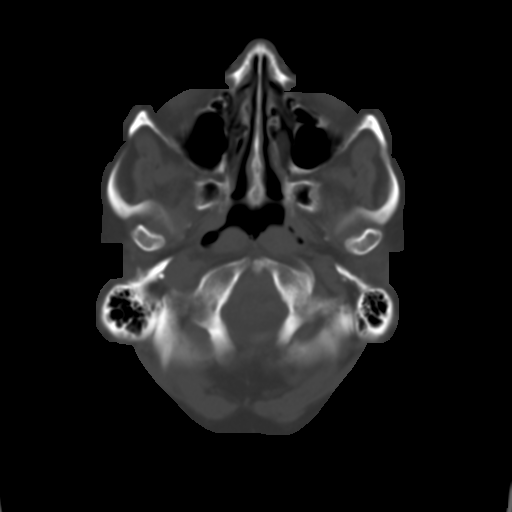
[im 5/30  brain]
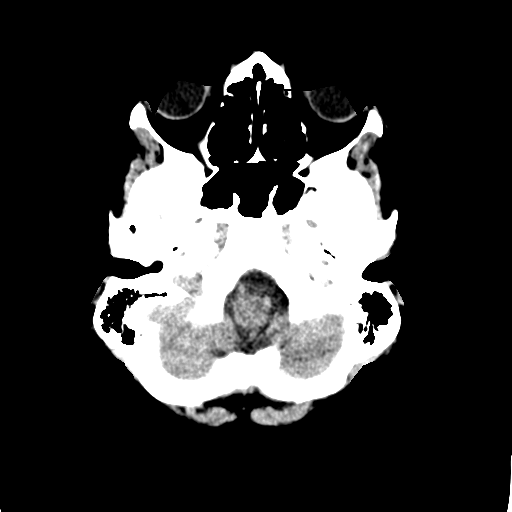
[im 7/30  brain]
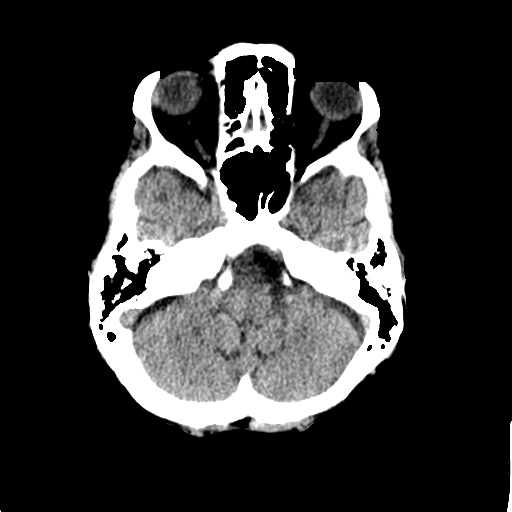
[im 9/30  brain]
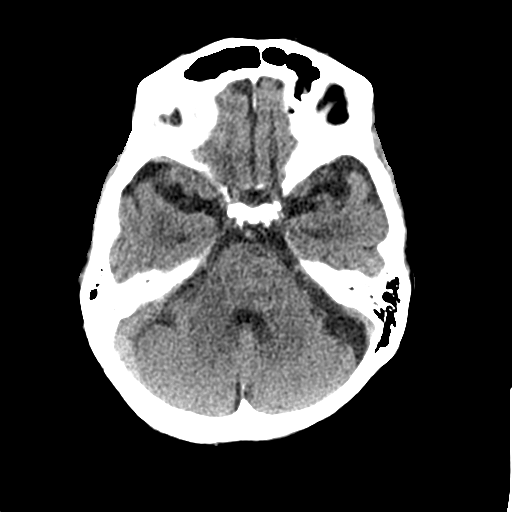
[im 11/30  brain]
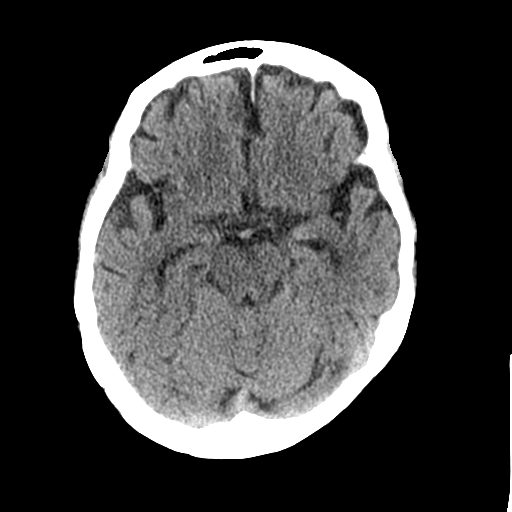
[im 11/30  bone]
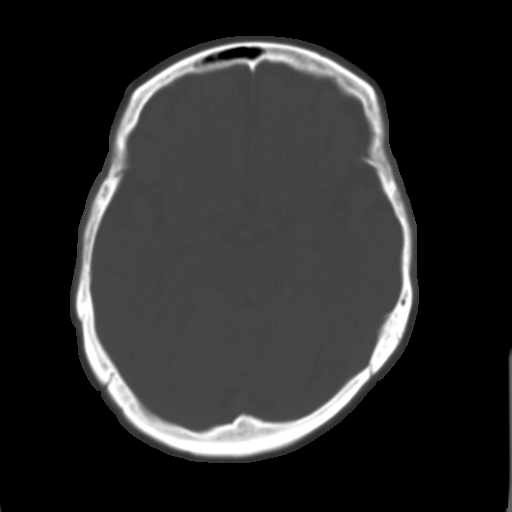
[im 13/30  brain]
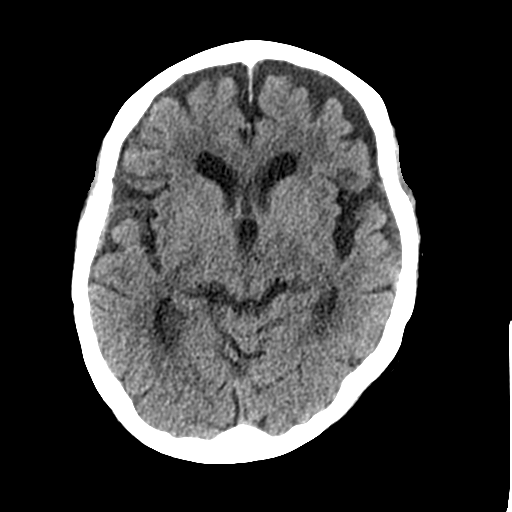
[im 15/30  brain]
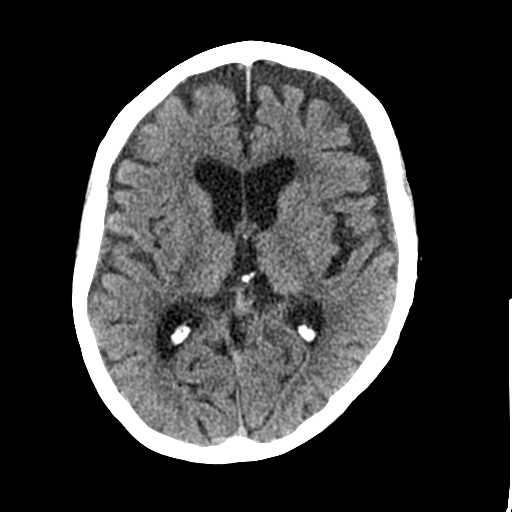
[im 17/30  brain]
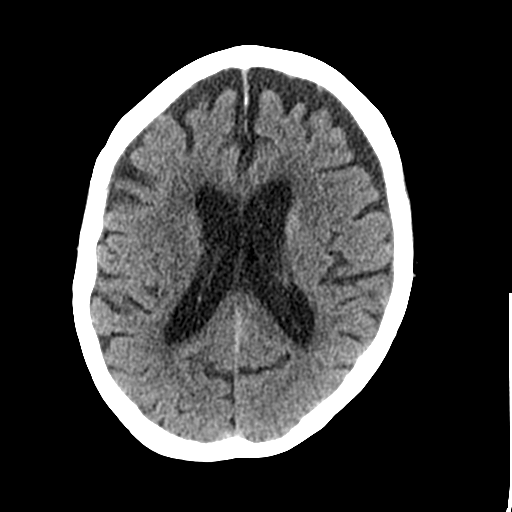
[im 19/30  brain]
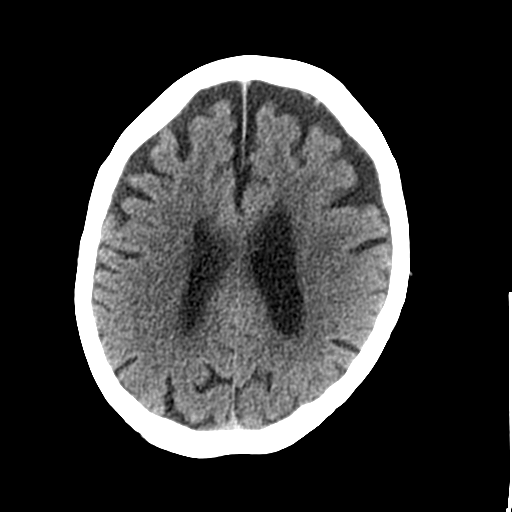
[im 19/30  bone]
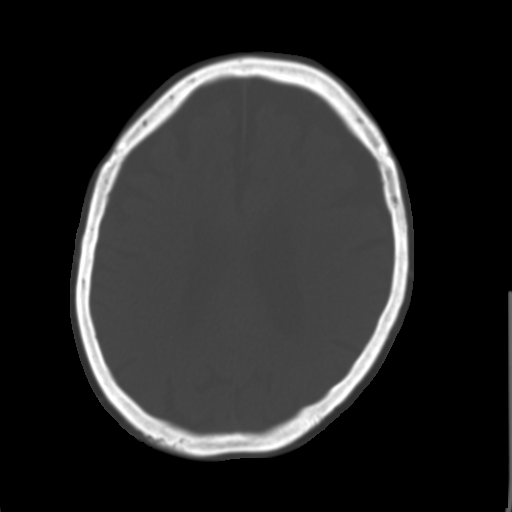
[im 21/30  brain]
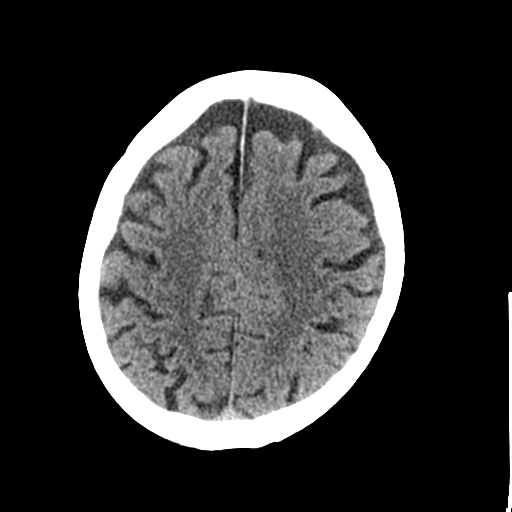
[im 23/30  brain]
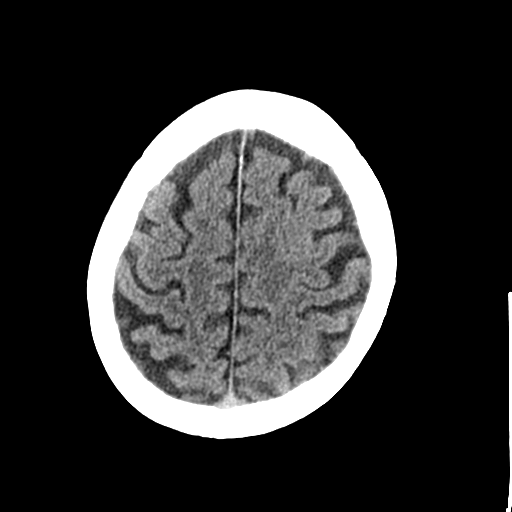
[im 25/30  brain]
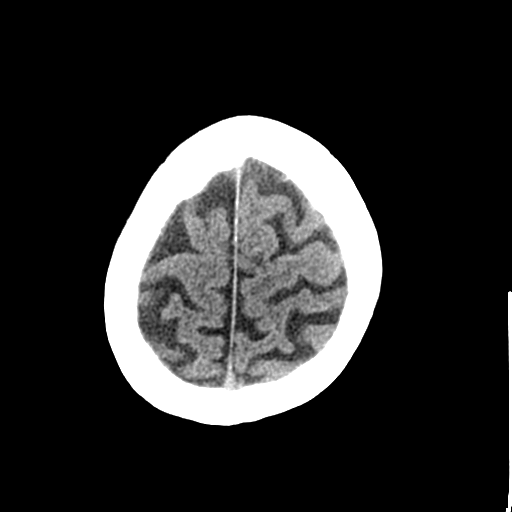
[im 27/30  brain]
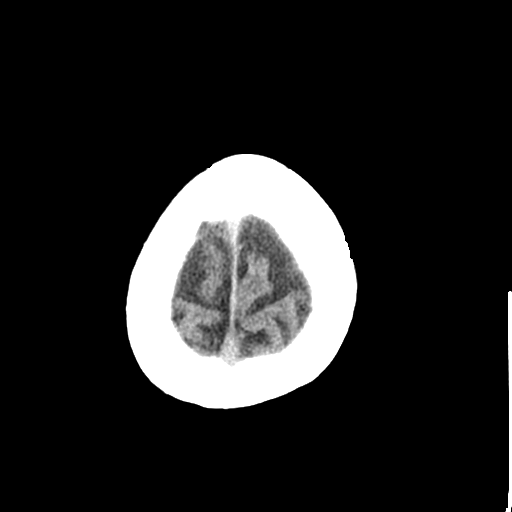
[im 27/30  bone]
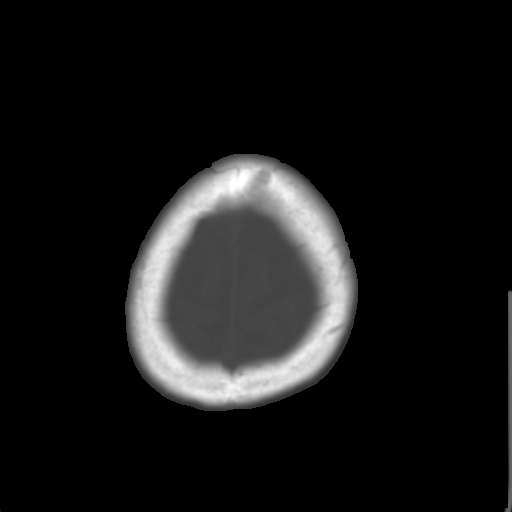

[Series 3: bone windows · axial · 0.43mm/px · z∈[+25,+65]mm · 3 of 30 slices shown]
[im 3/30  bone]
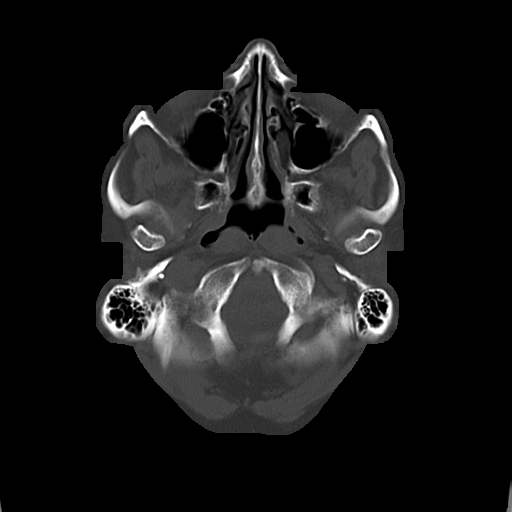
[im 7/30  bone]
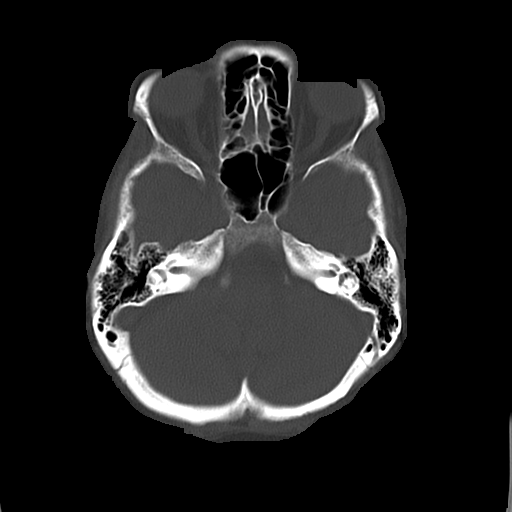
[im 11/30  bone]
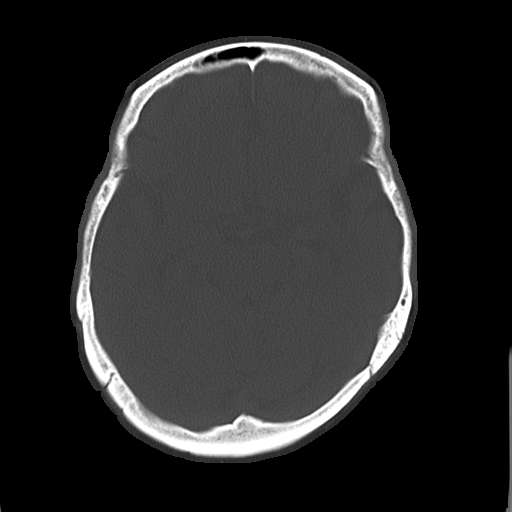

[16 of 30 positions shown; findings below may reference images not displayed]

FINDINGS: There is atrophy and chronic small vessel disease
changes. No acute intracranial abnormality.  Specifically, no
hemorrhage, hydrocephalus, mass lesion, acute infarction, or
significant intracranial injury.  No acute calvarial abnormality.
Visualized paranasal sinuses and mastoids clear.  Orbital soft
tissues unremarkable.
IMPRESSION: No acute intracranial abnormality.

Atrophy, chronic microvascular disease.

## 2012-11-08 ENCOUNTER — Other Ambulatory Visit: Payer: Self-pay | Admitting: Oncology

## 2012-11-08 ENCOUNTER — Other Ambulatory Visit (HOSPITAL_BASED_OUTPATIENT_CLINIC_OR_DEPARTMENT_OTHER): Payer: Medicare PPO | Admitting: Lab

## 2012-11-08 DIAGNOSIS — D47Z9 Other specified neoplasms of uncertain behavior of lymphoid, hematopoietic and related tissue: Secondary | ICD-10-CM

## 2012-11-08 DIAGNOSIS — D471 Chronic myeloproliferative disease: Secondary | ICD-10-CM

## 2012-11-08 DIAGNOSIS — D473 Essential (hemorrhagic) thrombocythemia: Secondary | ICD-10-CM

## 2012-11-08 LAB — CBC WITH DIFFERENTIAL/PLATELET
Basophils Absolute: 0.1 10*3/uL (ref 0.0–0.1)
EOS%: 3.2 % (ref 0.0–7.0)
Eosinophils Absolute: 0.6 10*3/uL — ABNORMAL HIGH (ref 0.0–0.5)
HGB: 14.7 g/dL (ref 11.6–15.9)
NEUT#: 11.7 10*3/uL — ABNORMAL HIGH (ref 1.5–6.5)
RBC: 5.7 10*6/uL — ABNORMAL HIGH (ref 3.70–5.45)
RDW: 16.8 % — ABNORMAL HIGH (ref 11.2–14.5)
lymph#: 4.2 10*3/uL — ABNORMAL HIGH (ref 0.9–3.3)

## 2012-11-19 ENCOUNTER — Other Ambulatory Visit: Payer: Self-pay | Admitting: Family Medicine

## 2012-12-03 ENCOUNTER — Other Ambulatory Visit: Payer: Self-pay | Admitting: Family

## 2012-12-06 ENCOUNTER — Other Ambulatory Visit (HOSPITAL_BASED_OUTPATIENT_CLINIC_OR_DEPARTMENT_OTHER): Payer: Medicare PPO | Admitting: Lab

## 2012-12-06 DIAGNOSIS — D471 Chronic myeloproliferative disease: Secondary | ICD-10-CM

## 2012-12-06 DIAGNOSIS — D47Z9 Other specified neoplasms of uncertain behavior of lymphoid, hematopoietic and related tissue: Secondary | ICD-10-CM

## 2012-12-06 LAB — CBC WITH DIFFERENTIAL/PLATELET
Basophils Absolute: 0.1 10*3/uL (ref 0.0–0.1)
Eosinophils Absolute: 0.5 10*3/uL (ref 0.0–0.5)
HCT: 43.2 % (ref 34.8–46.6)
MCH: 26.4 pg (ref 25.1–34.0)
MCHC: 33.6 g/dL (ref 31.5–36.0)
MONO#: 0.9 10*3/uL (ref 0.1–0.9)
MONO%: 5.6 % (ref 0.0–14.0)
Platelets: 468 10*3/uL — ABNORMAL HIGH (ref 145–400)
lymph#: 3.4 10*3/uL — ABNORMAL HIGH (ref 0.9–3.3)
nRBC: 0 % (ref 0–0)

## 2012-12-18 ENCOUNTER — Other Ambulatory Visit: Payer: Self-pay | Admitting: Family Medicine

## 2012-12-22 ENCOUNTER — Telehealth: Payer: Self-pay | Admitting: Oncology

## 2012-12-22 ENCOUNTER — Encounter: Payer: Self-pay | Admitting: Oncology

## 2012-12-22 NOTE — Telephone Encounter (Signed)
Former [facility] pt reassigned to [employer]. S/w pt sister today re new appt/provider for 4/15. Also confirmed 3/10 appt and mailed letter/schedule.

## 2013-01-03 ENCOUNTER — Other Ambulatory Visit (HOSPITAL_BASED_OUTPATIENT_CLINIC_OR_DEPARTMENT_OTHER): Payer: Medicare PPO

## 2013-01-03 DIAGNOSIS — D473 Essential (hemorrhagic) thrombocythemia: Secondary | ICD-10-CM

## 2013-01-03 LAB — CBC WITH DIFFERENTIAL/PLATELET
BASO%: 0.9 % (ref 0.0–2.0)
Eosinophils Absolute: 0.5 10*3/uL (ref 0.0–0.5)
HCT: 42.2 % (ref 34.8–46.6)
LYMPH%: 23.5 % (ref 14.0–49.7)
MCHC: 33.5 g/dL (ref 31.5–36.0)
MONO#: 1.2 10*3/uL — ABNORMAL HIGH (ref 0.1–0.9)
NEUT#: 12.5 10*3/uL — ABNORMAL HIGH (ref 1.5–6.5)
NEUT%: 66.3 % (ref 38.4–76.8)
Platelets: 461 10*3/uL — ABNORMAL HIGH (ref 145–400)
WBC: 18.8 10*3/uL — ABNORMAL HIGH (ref 3.9–10.3)
lymph#: 4.4 10*3/uL — ABNORMAL HIGH (ref 0.9–3.3)

## 2013-02-05 ENCOUNTER — Other Ambulatory Visit: Payer: Self-pay | Admitting: Oncology

## 2013-02-07 ENCOUNTER — Other Ambulatory Visit: Payer: Medicare Other | Admitting: Lab

## 2013-02-08 ENCOUNTER — Ambulatory Visit (HOSPITAL_BASED_OUTPATIENT_CLINIC_OR_DEPARTMENT_OTHER): Payer: Medicare PPO | Admitting: Oncology

## 2013-02-08 ENCOUNTER — Telehealth: Payer: Self-pay | Admitting: Oncology

## 2013-02-08 ENCOUNTER — Other Ambulatory Visit (HOSPITAL_BASED_OUTPATIENT_CLINIC_OR_DEPARTMENT_OTHER): Payer: Medicare PPO | Admitting: Lab

## 2013-02-08 VITALS — BP 143/94 | HR 69 | Temp 97.2°F | Resp 20 | Ht 59.05 in | Wt 109.3 lb

## 2013-02-08 DIAGNOSIS — I10 Essential (primary) hypertension: Secondary | ICD-10-CM

## 2013-02-08 DIAGNOSIS — D47Z9 Other specified neoplasms of uncertain behavior of lymphoid, hematopoietic and related tissue: Secondary | ICD-10-CM

## 2013-02-08 DIAGNOSIS — D471 Chronic myeloproliferative disease: Secondary | ICD-10-CM

## 2013-02-08 DIAGNOSIS — D473 Essential (hemorrhagic) thrombocythemia: Secondary | ICD-10-CM

## 2013-02-08 LAB — CBC WITH DIFFERENTIAL/PLATELET
Basophils Absolute: 0.1 10*3/uL (ref 0.0–0.1)
HCT: 38.6 % (ref 34.8–46.6)
HGB: 12.4 g/dL (ref 11.6–15.9)
LYMPH%: 18.7 % (ref 14.0–49.7)
MCH: 27 pg (ref 25.1–34.0)
MONO#: 1 10*3/uL — ABNORMAL HIGH (ref 0.1–0.9)
NEUT%: 70.8 % (ref 38.4–76.8)
Platelets: 475 10*3/uL — ABNORMAL HIGH (ref 145–400)
WBC: 18 10*3/uL — ABNORMAL HIGH (ref 3.9–10.3)
lymph#: 3.4 10*3/uL — ABNORMAL HIGH (ref 0.9–3.3)

## 2013-02-08 NOTE — Telephone Encounter (Signed)
gv and printed appt schedule and avs for pt.... °

## 2013-02-08 NOTE — Progress Notes (Signed)
Hematology and Oncology Follow Up Visit  Donna Webb [id number] [birth year] 72 y.o. 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 She was treated with hydrea till 08/2012 and switch to Anagrelide due to lack of response.   Current therapy: Anagrelide 0.5 mg BID started around 08/2012.   Interim History:   Donna Webb is seen today with her sister in accompaniment for followup of her myeloproliferative disorder. She is on Anagrelide and tolerating it well. CBCs have been checked monthly since the start of Anagrelide and had good response. There has been no apparent difficulty with fevers, chills or night sweats. No nausea, or emesis issues. No bleeding or bruising symptoms. She did have a dermatological reaction with facial redness and pain after a biopsy and getting care under her dermatologist.   A detailed review of systems is otherwise noncontributory as noted below.  Review of Systems: Constitutional:  no weight loss, fever, night sweats and feels well Eyes: no complaints ENT: no complaints Cardiovascular: negative Respiratory: negative Neurological: negative Dermatological: negative Gastrointestinal: negative Genito-Urinary: negative Hematological and Lymphatic: negative Breast: negative Musculoskeletal: negative Remaining ROS negative.   Medications:   I have reviewed the patient's current medications.  Current Outpatient Prescriptions  Medication Sig Dispense Refill  . aspirin 81 MG tablet Take 81 mg by mouth daily.      Marland Kitchen atenolol (TENORMIN) 50 MG tablet TAKE 1 TABLET TWO TIMES A DAY  180 tablet  3  . Calcium Carbonate-Vitamin D (CALCIUM-VITAMIN D) 500-200 MG-UNIT per tablet Take 1 tablet by mouth 2 (two) times daily with meals.        . cloNIDine (CATAPRES) 0.2 MG tablet TAKE 1 TABLET TWO TIMES A DAY  180 tablet  3  . hydroxyurea (HYDREA) 500 MG capsule Take 1 capsule (500 mg total) by  mouth daily. May take with food to minimize GI side effects.  30 capsule  3  . lisinopril (PRINIVIL,ZESTRIL) 20 MG tablet TAKE 1 TABLET DAILY  90 tablet  3  . Multiple Vitamin (MULTIVITAMIN) tablet Take 1 tablet by mouth daily.          Allergies:  Allergies  Allergen Reactions  . Amlodipine Besylate     REACTION: intolerance    Physical Exam: Filed Vitals:   01/28/12 0930  BP: 136/82  Pulse: 77  Temp: 97.8 F (36.6 C)    Body mass index is 21.07 kg/(m^2). HEENT:  Sclerae anicteric, conjunctivae pink.  Oropharynx clear.  No mucositis or candidiasis.   Nodes:  No cervical, supraclavicular, or axillary lymphadenopathy palpated.  Lungs:  Clear to auscultation bilaterally.  No crackles, rhonchi, or wheezes.   Heart:  Regular rate and rhythm.   Abdomen:  Soft, nontender.  Positive bowel sounds.  No organomegaly or masses palpated.   Musculoskeletal:  No focal spinal tenderness to palpation, but no evidence of upper thoracic scoliosis. Extremities:  Benign.  No peripheral edema or cyanosis.   Skin:  Increased erythema to the face     Lab Results: CBC    Component Value Date/Time   WBC 18.0* 02/08/2013 1419   WBC 17.5* 08/19/2012 0914   RBC 4.58 02/08/2013 1419   RBC 5.72* 08/19/2012 0914   HGB 12.4 02/08/2013 1419   HGB 15.5* 08/19/2012 0914   HCT 38.6 02/08/2013 1419   HCT 48.5* 08/19/2012 0914   PLT 475* 02/08/2013 1419   PLT 1337.0 cH* 08/19/2012 0914   MCV 84.3 02/08/2013 1419   MCV 84.8  08/19/2012 0914   MCH 27.0 02/08/2013 1419   MCH 31.5 12/10/2011 1455   MCHC 32.0 02/08/2013 1419   MCHC 31.9 08/19/2012 0914   RDW 18.4* 02/08/2013 1419   RDW 13.5 08/19/2012 0914   LYMPHSABS 3.4* 02/08/2013 1419   LYMPHSABS 3.9 08/19/2012 0914   MONOABS 1.0* 02/08/2013 1419   MONOABS 1.1* 08/19/2012 0914   EOSABS 0.8* 02/08/2013 1419   EOSABS 0.6 08/19/2012 0914   BASOSABS 0.1 02/08/2013 1419   BASOSABS 0.1 08/19/2012 0914     Assessment and Plan: Patient is a 72 year old  woman with     1. Myeloproliferative disorder currently on Anagrelide 0.5 mg per day. Today CBC showed excellent  response without complication to the medication.  The plan is to continue at the current dose and schedule. I will check her counts in 2 months and a visit in 4 months. If her counts remain stable, then a follow up every months is all that is needed.   2. Hypertension managed by Dr. Gershon Crane  3. Facial erythema: followed by Dermatology, not related to Anagrelide.

## 2013-03-07 ENCOUNTER — Other Ambulatory Visit: Payer: Medicare Other | Admitting: Lab

## 2013-03-08 ENCOUNTER — Other Ambulatory Visit: Payer: Self-pay | Admitting: Oncology

## 2013-03-08 DIAGNOSIS — D47Z9 Other specified neoplasms of uncertain behavior of lymphoid, hematopoietic and related tissue: Secondary | ICD-10-CM

## 2013-03-25 ENCOUNTER — Telehealth: Payer: Self-pay | Admitting: Family Medicine

## 2013-03-25 NOTE — Telephone Encounter (Signed)
Pt needs new rxs [facility] [address] atenolol 50 mg #180, clonidine 0.2mg #180 and lisinopril 20 mg #90. Pt is requesting 90 day supply with refills. [facility] no longer using [facility]

## 2013-03-28 MED ORDER — ATENOLOL 50 MG PO TABS: 50.0000 mg | ORAL_TABLET | Freq: Two times a day (BID) | ORAL | Status: DC

## 2013-03-28 MED ORDER — CLONIDINE HCL 0.2 MG PO TABS: 0.2000 mg | ORAL_TABLET | Freq: Two times a day (BID) | ORAL | Status: DC

## 2013-03-28 MED ORDER — LISINOPRIL 20 MG PO TABS: 20.0000 mg | ORAL_TABLET | Freq: Every day | ORAL | Status: DC

## 2013-03-28 NOTE — Telephone Encounter (Signed)
I sent all 3 scripts e-scribe. 

## 2013-04-04 ENCOUNTER — Other Ambulatory Visit (HOSPITAL_BASED_OUTPATIENT_CLINIC_OR_DEPARTMENT_OTHER): Payer: Medicare PPO

## 2013-04-04 DIAGNOSIS — D473 Essential (hemorrhagic) thrombocythemia: Secondary | ICD-10-CM

## 2013-04-04 LAB — CBC WITH DIFFERENTIAL/PLATELET
Basophils Absolute: 0.1 10*3/uL (ref 0.0–0.1)
EOS%: 4.1 % (ref 0.0–7.0)
HGB: 13.4 g/dL (ref 11.6–15.9)
LYMPH%: 22.7 % (ref 14.0–49.7)
MCH: 29.5 pg (ref 25.1–34.0)
MCV: 87 fL (ref 79.5–101.0)
MONO%: 6.6 % (ref 0.0–14.0)
Platelets: 510 10*3/uL — ABNORMAL HIGH (ref 145–400)
RBC: 4.55 10*6/uL (ref 3.70–5.45)
RDW: 15.3 % — ABNORMAL HIGH (ref 11.2–14.5)

## 2013-05-02 ENCOUNTER — Ambulatory Visit (INDEPENDENT_AMBULATORY_CARE_PROVIDER_SITE_OTHER): Payer: Medicare PPO | Admitting: Family Medicine

## 2013-05-02 ENCOUNTER — Encounter: Payer: Self-pay | Admitting: Family Medicine

## 2013-05-02 VITALS — BP 110/70 | HR 68 | Temp 98.1°F | Wt 108.0 lb

## 2013-05-02 DIAGNOSIS — I10 Essential (primary) hypertension: Secondary | ICD-10-CM

## 2013-05-02 DIAGNOSIS — L259 Unspecified contact dermatitis, unspecified cause: Secondary | ICD-10-CM

## 2013-05-02 NOTE — Progress Notes (Signed)
  Subjective:    Patient ID: Donna Webb, female    DOB: Jan 15, 1941, 72 y.o.   MRN: [id number]  HPI Here for itching, redness and flaking of the face 3 months after treatment per Dr. Terri Piedra. 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 He told her to apply Neosporin to the skin until it heals. However it has never healed since then an dit continues to itch and burn and irritate her. She wears sun block also.    Review of Systems  Constitutional: Negative.   Skin: Positive for rash.       Objective:   Physical Exam  Constitutional: She appears well-developed and well-nourished.  Cardiovascular: Normal rate, regular rhythm, normal heart sounds and intact distal pulses.   Pulmonary/Chest: Effort normal and breath sounds normal.  Skin:  Her entire face, including the eyelids, is red, cracked, and scaly. The eyelids are slightly swollen           Assessment & Plan:  I think she is allergic to Neosporin and is thus keeping her skin inflamed and not allowing it to heal. Advised her to never use Neosporin again. Instead she will apply 0.5% hydrocortisone ointment to the face bid. Recheck in one week. Her HTN is well controlled.

## 2013-05-03 ENCOUNTER — Other Ambulatory Visit: Payer: Self-pay | Admitting: Family Medicine

## 2013-05-03 MED ORDER — DONEPEZIL HCL 5 MG PO TABS: 5.0000 mg | ORAL_TABLET | Freq: Every evening | ORAL | Status: DC | PRN

## 2013-05-03 NOTE — Telephone Encounter (Signed)
Refill request for Donepezil 5 mg and per Dr. Clent Ridges okay to refill for a year. [facility]

## 2013-05-09 ENCOUNTER — Other Ambulatory Visit: Payer: Medicare Other | Admitting: Lab

## 2013-05-10 ENCOUNTER — Encounter: Payer: Self-pay | Admitting: Family Medicine

## 2013-06-06 ENCOUNTER — Other Ambulatory Visit: Payer: Medicare Other | Admitting: Lab

## 2013-06-07 ENCOUNTER — Ambulatory Visit (HOSPITAL_BASED_OUTPATIENT_CLINIC_OR_DEPARTMENT_OTHER): Payer: Medicare PPO | Admitting: Oncology

## 2013-06-07 ENCOUNTER — Other Ambulatory Visit (HOSPITAL_BASED_OUTPATIENT_CLINIC_OR_DEPARTMENT_OTHER): Payer: Medicare PPO | Admitting: Lab

## 2013-06-07 ENCOUNTER — Telehealth: Payer: Self-pay | Admitting: Oncology

## 2013-06-07 ENCOUNTER — Encounter: Payer: Self-pay | Admitting: Oncology

## 2013-06-07 VITALS — BP 101/73 | HR 72 | Temp 97.0°F | Resp 18 | Ht 59.05 in | Wt 107.9 lb

## 2013-06-07 DIAGNOSIS — D471 Chronic myeloproliferative disease: Secondary | ICD-10-CM

## 2013-06-07 DIAGNOSIS — D47Z9 Other specified neoplasms of uncertain behavior of lymphoid, hematopoietic and related tissue: Secondary | ICD-10-CM

## 2013-06-07 LAB — CBC WITH DIFFERENTIAL/PLATELET
BASO%: 1.1 % (ref 0.0–2.0)
EOS%: 4 % (ref 0.0–7.0)
HCT: 36.9 % (ref 34.8–46.6)
LYMPH%: 22.7 % (ref 14.0–49.7)
MCH: 28.9 pg (ref 25.1–34.0)
MCHC: 33.6 g/dL (ref 31.5–36.0)
MONO#: 1.2 10*3/uL — ABNORMAL HIGH (ref 0.1–0.9)
MONO%: 6.7 % (ref 0.0–14.0)
NEUT%: 65.5 % (ref 38.4–76.8)
Platelets: 414 10*3/uL — ABNORMAL HIGH (ref 145–400)
RBC: 4.29 10*6/uL (ref 3.70–5.45)
WBC: 18.1 10*3/uL — ABNORMAL HIGH (ref 3.9–10.3)

## 2013-06-07 LAB — COMPREHENSIVE METABOLIC PANEL (CC13)
ALT: 6 U/L (ref 0–55)
AST: 20 U/L (ref 5–34)
Alkaline Phosphatase: 46 U/L (ref 40–150)
CO2: 27 mEq/L (ref 22–29)
Creatinine: 0.9 mg/dL (ref 0.6–1.1)
Sodium: 140 mEq/L (ref 136–145)
Total Bilirubin: 0.37 mg/dL (ref 0.20–1.20)
Total Protein: 6.9 g/dL (ref 6.4–8.3)

## 2013-06-07 MED ORDER — ANAGRELIDE HCL 0.5 MG PO CAPS: ORAL_CAPSULE | ORAL | Status: DC

## 2013-06-07 NOTE — Progress Notes (Signed)
Hematology and Oncology Follow Up Visit  Donna Webb [id number] 01/21/1941 72 y.o. 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 She was treated with hydrea till 08/2012 and switch to Anagrelide due to lack of response.   Current therapy: Anagrelide 0.5 mg BID started around 08/2012.  Interim History:  Ms. Donna Webb is seen today with her sister in accompaniment for followup of her myeloproliferative disorder. She is on Anagrelide and tolerating it well. CBCs have been stable since the start of Anagrelide and had good response. There has been no apparent difficulty with fevers, chills or night sweats. No nausea, or emesis issues. No bleeding or bruising symptoms. She did have a dermatological reaction with facial redness and pain after a biopsy and getting care under her dermatologist.   A detailed review of systems is otherwise noncontributory as noted below.   Medications: I have reviewed the patient's current medications.  Current Outpatient Prescriptions  Medication Sig Dispense Refill  . anagrelide (AGRYLIN) 0.5 MG capsule TAKE 1 CAPSULE (0.5 MG TOTAL) BY MOUTH 2 (TWO) TIMES DAILY.  60 capsule  3  . aspirin 81 MG tablet Take 81 mg by mouth daily.      Marland Kitchen atenolol (TENORMIN) 50 MG tablet Take 1 tablet (50 mg total) by mouth 2 (two) times daily.  180 tablet  1  . Calcium Carbonate-Vitamin D (CALCIUM-VITAMIN D) 500-200 MG-UNIT per tablet Take 1 tablet by mouth 2 (two) times daily with meals.        . cloNIDine (CATAPRES) 0.2 MG tablet Take 1 tablet (0.2 mg total) by mouth 2 (two) times daily.  180 tablet  1  . donepezil (ARICEPT) 5 MG tablet Take 1 tablet (5 mg total) by mouth at bedtime as needed.  30 tablet  11  . lisinopril (PRINIVIL,ZESTRIL) 20 MG tablet Take 1 tablet (20 mg total) by mouth daily.  90 tablet  1  . Multiple Vitamin (MULTIVITAMIN) tablet Take 1 tablet  by mouth daily.         No current facility-administered medications for this visit.     Allergies:  Allergies  Allergen Reactions  . Amlodipine Besylate     REACTION: intolerance  . Neosporin (Neomycin-Bacitracin Zn-Polymyx)     Rash     Past Medical History, Surgical history, Social history, and Family History were reviewed and updated.  Review of Systems:  Constitutional:  Negative for fever, chills, night sweats, anorexia, weight loss, pain. Cardiovascular: no chest pain or dyspnea on exertion Respiratory: no cough, shortness of breath, or wheezing Neurological: no TIA or stroke symptoms Dermatological: negative ENT: negative Skin: Negative. Gastrointestinal: no abdominal pain, change in bowel habits, or black or bloody stools Genito-Urinary: no dysuria, trouble voiding, or hematuria Hematological and Lymphatic: negative Breast: negative for breast lumps Musculoskeletal: negative Remaining ROS negative.  Physical Exam: Blood pressure 101/73, pulse 72, temperature 97 F (36.1 C), temperature source Oral, resp. rate 18, height 4' 11.05" (1.5 m), weight 107 lb 14.4 oz (48.943 kg). ECOG: 1-2 General appearance: alert, cooperative and no distress Head: Normocephalic, without obvious abnormality, atraumatic Neck: no adenopathy, no carotid bruit, no JVD, supple, symmetrical, trachea midline and thyroid not enlarged, symmetric, no tenderness/mass/nodules Lymph nodes: Cervical, supraclavicular, and axillary nodes normal. Heart:regular rate and rhythm, S1, S2 normal, no murmur, click, rub or gallop Lung:chest clear, no wheezing, rales, normal symmetric air entry, no tachypnea, retractions or cyanosis Abdomen: soft, non-tender, without masses  or organomegaly EXT:no erythema, induration, or nodules   Lab Results: Lab Results  Component Value Date   WBC 18.1* 06/07/2013   HGB 12.4 06/07/2013   HCT 36.9 06/07/2013   MCV 86.0 06/07/2013   PLT 414* 06/07/2013     Chemistry       Component Value Date/Time   NA 140 06/07/2013 0902   NA 139 12/16/2011 1013   K 4.2 06/07/2013 0902   K 4.7 12/16/2011 1013   CL 101 12/16/2011 1013   CO2 27 06/07/2013 0902   CO2 25 12/16/2011 1013   BUN 19.4 06/07/2013 0902   BUN 14 12/16/2011 1013   CREATININE 0.9 06/07/2013 0902   CREATININE 0.89 12/16/2011 1013      Component Value Date/Time   CALCIUM 10.3 06/07/2013 0902   CALCIUM 11.4* 12/16/2011 1013   ALKPHOS 46 06/07/2013 0902   ALKPHOS 63 12/16/2011 1013   AST 20 06/07/2013 0902   AST 45* 12/16/2011 1013   ALT 6 06/07/2013 0902   ALT 36* 12/16/2011 1013   BILITOT 0.37 06/07/2013 0902   BILITOT 0.6 12/16/2011 1013     Impression and Plan:  Patient is a 72 year old woman with  1. Myeloproliferative disorder currently on Anagrelide 0.5 mg per day. Today CBC showed excellent response without complication to the medication. The plan is to continue at the current dose and schedule. I will check her counts in 2 months and a visit in 4 months. 2. Hypertension managed by Dr. Gershon Crane  3. Facial erythema: Resolved. She is followed by Dermatology. This is unrelated to Anagrelide.    Spent more than half the time coordinating care.    [place], [place] 8/12/20141:36 PM

## 2013-06-07 NOTE — Telephone Encounter (Signed)
gv and printed appt sched and avs for pt  °

## 2013-07-04 ENCOUNTER — Other Ambulatory Visit: Payer: Medicare Other | Admitting: Lab

## 2013-07-20 ENCOUNTER — Ambulatory Visit (INDEPENDENT_AMBULATORY_CARE_PROVIDER_SITE_OTHER): Payer: Medicare PPO

## 2013-07-20 DIAGNOSIS — Z23 Encounter for immunization: Secondary | ICD-10-CM

## 2013-08-02 ENCOUNTER — Other Ambulatory Visit (HOSPITAL_BASED_OUTPATIENT_CLINIC_OR_DEPARTMENT_OTHER): Payer: Medicare PPO

## 2013-08-02 DIAGNOSIS — D47Z9 Other specified neoplasms of uncertain behavior of lymphoid, hematopoietic and related tissue: Secondary | ICD-10-CM

## 2013-08-02 LAB — CBC WITH DIFFERENTIAL/PLATELET
Basophils Absolute: 0.2 10*3/uL — ABNORMAL HIGH (ref 0.0–0.1)
EOS%: 4.3 % (ref 0.0–7.0)
Eosinophils Absolute: 0.7 10*3/uL — ABNORMAL HIGH (ref 0.0–0.5)
HCT: 36.9 % (ref 34.8–46.6)
HGB: 12.4 g/dL (ref 11.6–15.9)
MCH: 28.6 pg (ref 25.1–34.0)
MCV: 84.9 fL (ref 79.5–101.0)
MONO#: 1 10*3/uL — ABNORMAL HIGH (ref 0.1–0.9)
MONO%: 6.6 % (ref 0.0–14.0)
NEUT%: 65.9 % (ref 38.4–76.8)
RBC: 4.35 10*6/uL (ref 3.70–5.45)
RDW: 14.9 % — ABNORMAL HIGH (ref 11.2–14.5)
WBC: 15.8 10*3/uL — ABNORMAL HIGH (ref 3.9–10.3)
lymph#: 3.5 10*3/uL — ABNORMAL HIGH (ref 0.9–3.3)

## 2013-08-04 ENCOUNTER — Telehealth: Payer: Self-pay | Admitting: *Deleted

## 2013-08-04 NOTE — Telephone Encounter (Signed)
Message copied by Reesa Chew on Thu Aug 04, 2013  2:37 PM ------      Message from: Myrtis Ser      Created: Tue Aug 02, 2013  7:54 PM       Call pt. CBC remains stable. No change in the dose of Agrilyn. Keep appt as scheduled. ------

## 2013-08-04 NOTE — Telephone Encounter (Signed)
Spoke with sister, teresa. cbc normal and stay on same dose of agrilyn. Keep regularly scheduled appt 09/27/13.

## 2013-08-08 ENCOUNTER — Other Ambulatory Visit: Payer: Medicare Other | Admitting: Lab

## 2013-08-24 ENCOUNTER — Other Ambulatory Visit: Payer: Self-pay | Admitting: Family Medicine

## 2013-08-24 DIAGNOSIS — Z1231 Encounter for screening mammogram for malignant neoplasm of breast: Secondary | ICD-10-CM

## 2013-09-05 ENCOUNTER — Other Ambulatory Visit: Payer: Medicare Other | Admitting: Lab

## 2013-09-18 ENCOUNTER — Other Ambulatory Visit: Payer: Self-pay | Admitting: Family Medicine

## 2013-09-26 ENCOUNTER — Ambulatory Visit (HOSPITAL_COMMUNITY)
Admission: RE | Admit: 2013-09-26 | Discharge: 2013-09-26 | Disposition: A | Discharge status: Home / Self Care | Payer: Medicare PPO | Source: Ambulatory Visit | Attending: Family Medicine | Admitting: Family Medicine

## 2013-09-26 DIAGNOSIS — Z1231 Encounter for screening mammogram for malignant neoplasm of breast: Secondary | ICD-10-CM | POA: Insufficient documentation

## 2013-09-27 ENCOUNTER — Ambulatory Visit (HOSPITAL_BASED_OUTPATIENT_CLINIC_OR_DEPARTMENT_OTHER): Payer: Medicare PPO | Admitting: Oncology

## 2013-09-27 ENCOUNTER — Other Ambulatory Visit (HOSPITAL_BASED_OUTPATIENT_CLINIC_OR_DEPARTMENT_OTHER): Payer: Medicare PPO | Admitting: Lab

## 2013-09-27 ENCOUNTER — Telehealth: Payer: Self-pay | Admitting: Oncology

## 2013-09-27 VITALS — BP 145/71 | HR 65 | Temp 97.0°F | Resp 17 | Ht 59.0 in | Wt 107.8 lb

## 2013-09-27 DIAGNOSIS — D471 Chronic myeloproliferative disease: Secondary | ICD-10-CM

## 2013-09-27 DIAGNOSIS — D47Z9 Other specified neoplasms of uncertain behavior of lymphoid, hematopoietic and related tissue: Secondary | ICD-10-CM

## 2013-09-27 LAB — COMPREHENSIVE METABOLIC PANEL (CC13)
AST: 25 U/L (ref 5–34)
Albumin: 4 g/dL (ref 3.5–5.0)
Alkaline Phosphatase: 49 U/L (ref 40–150)
BUN: 15 mg/dL (ref 7.0–26.0)
Chloride: 101 mEq/L (ref 98–109)
Creatinine: 0.9 mg/dL (ref 0.6–1.1)
Potassium: 4.4 mEq/L (ref 3.5–5.1)
Total Bilirubin: 0.56 mg/dL (ref 0.20–1.20)

## 2013-09-27 LAB — CBC WITH DIFFERENTIAL/PLATELET
Basophils Absolute: 0.2 10*3/uL — ABNORMAL HIGH (ref 0.0–0.1)
EOS%: 3.1 % (ref 0.0–7.0)
HCT: 38.3 % (ref 34.8–46.6)
HGB: 12.7 g/dL (ref 11.6–15.9)
LYMPH%: 20.6 % (ref 14.0–49.7)
MCH: 28.8 pg (ref 25.1–34.0)
MCV: 87.1 fL (ref 79.5–101.0)
MONO%: 5.9 % (ref 0.0–14.0)
NEUT%: 69.4 % (ref 38.4–76.8)
Platelets: 310 10*3/uL (ref 145–400)
RDW: 15.3 % — ABNORMAL HIGH (ref 11.2–14.5)
WBC: 18 10*3/uL — ABNORMAL HIGH (ref 3.9–10.3)

## 2013-09-27 NOTE — Progress Notes (Signed)
Hematology and Oncology Follow Up Visit  Donna Webb [id number] 21-Sep-1941 72 y.o. 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 She was treated with hydrea till 08/2012 and switch to Anagrelide due to lack of response.   Current therapy: Anagrelide 0.5 mg BID started around 08/2012.  Interim History:  Donna Webb is seen today with her sister in accompaniment for followup of her myeloproliferative disorder. She is on Anagrelide and tolerating it well. CBCs have been stable since the start of Anagrelide and had good response. There has been no apparent difficulty with fevers, chills or night sweats. No nausea, or emesis issues. No bleeding or bruising symptoms. She has not reported any lower extremity edema or any other complications. She's continued to have a reasonable quality of life without any deterioration.  A detailed review of systems is otherwise noncontributory as noted below.   Medications: I have reviewed the patient's current medications.  Current Outpatient Prescriptions  Medication Sig Dispense Refill  . anagrelide (AGRYLIN) 0.5 MG capsule TAKE 1 CAPSULE (0.5 MG TOTAL) BY MOUTH 2 (TWO) TIMES DAILY.  60 capsule  3  . aspirin 81 MG tablet Take 81 mg by mouth daily.      Marland Kitchen atenolol (TENORMIN) 50 MG tablet TAKE 1 TABLET (50 MG TOTAL) BY MOUTH 2 (TWO) TIMES DAILY.  180 tablet  0  . Calcium Carbonate-Vitamin D (CALCIUM-VITAMIN D) 500-200 MG-UNIT per tablet Take 1 tablet by mouth 2 (two) times daily with meals.        . cloNIDine (CATAPRES) 0.2 MG tablet TAKE 1 TABLET (0.2 MG TOTAL) BY MOUTH 2 (TWO) TIMES DAILY.  180 tablet  0  . donepezil (ARICEPT) 5 MG tablet Take 1 tablet (5 mg total) by mouth at bedtime as needed.  30 tablet  11  . lisinopril (PRINIVIL,ZESTRIL) 20 MG tablet TAKE 1 TABLET (20 MG TOTAL) BY MOUTH DAILY.  90 tablet  0  . Multiple Vitamin  (MULTIVITAMIN) tablet Take 1 tablet by mouth daily.         No current facility-administered medications for this visit.     Allergies:  Allergies  Allergen Reactions  . Amlodipine Besylate     REACTION: intolerance  . Neosporin [Neomycin-Bacitracin Zn-Polymyx]     Rash     Past Medical History, Surgical history, Social history, and Family History were reviewed and updated.  Review of Systems:  Constitutional:  Negative for fever, chills, night sweats, anorexia, weight loss, pain. Cardiovascular: no chest pain or dyspnea on exertion Respiratory: no cough, shortness of breath, or wheezing Neurological: no TIA or stroke symptoms Dermatological: negative ENT: negative Skin: Negative. Gastrointestinal: no abdominal pain, change in bowel habits, or black or bloody stools Genito-Urinary: no dysuria, trouble voiding, or hematuria Hematological and Lymphatic: negative Breast: negative for breast lumps Musculoskeletal: negative Remaining ROS negative.  Physical Exam: Blood pressure 145/71, pulse 65, temperature 97 F (36.1 C), temperature source Oral, resp. rate 17, height 4\' 11"  (1.499 m), weight 107 lb 12.8 oz (48.898 kg), SpO2 96.00%. ECOG: 1-2 General appearance: alert, cooperative and no distress Head: Normocephalic, without obvious abnormality, atraumatic Neck: no adenopathy, no carotid bruit, no JVD, supple, symmetrical, trachea midline and thyroid not enlarged, symmetric, no tenderness/mass/nodules Lymph nodes: Cervical, supraclavicular, and axillary nodes normal. Heart:regular rate and rhythm, S1, S2 normal, no murmur, click, rub or gallop Lung:chest clear, no wheezing, rales, normal symmetric air entry, no tachypnea, retractions or cyanosis  Abdomen: soft, non-tender, without masses or organomegaly EXT:no erythema, induration, or nodules   Lab Results: Lab Results  Component Value Date   WBC 18.0* 09/27/2013   HGB 12.7 09/27/2013   HCT 38.3 09/27/2013   MCV 87.1  09/27/2013   PLT 310 09/27/2013     Chemistry      Component Value Date/Time   NA 140 06/07/2013 0902   NA 139 12/16/2011 1013   K 4.2 06/07/2013 0902   K 4.7 12/16/2011 1013   CL 101 12/16/2011 1013   CO2 27 06/07/2013 0902   CO2 25 12/16/2011 1013   BUN 19.4 06/07/2013 0902   BUN 14 12/16/2011 1013   CREATININE 0.9 06/07/2013 0902   CREATININE 0.89 12/16/2011 1013      Component Value Date/Time   CALCIUM 10.3 06/07/2013 0902   CALCIUM 11.4* 12/16/2011 1013   ALKPHOS 46 06/07/2013 0902   ALKPHOS 63 12/16/2011 1013   AST 20 06/07/2013 0902   AST 45* 12/16/2011 1013   ALT 6 06/07/2013 0902   ALT 36* 12/16/2011 1013   BILITOT 0.37 06/07/2013 0902   BILITOT 0.6 12/16/2011 1013     Impression and Plan:  Patient is a 72 year old woman with  1. Myeloproliferative disorder currently on Anagrelide 0.5 mg per day. Today CBC showed excellent response without complication to the medication. The plan is to continue at the current dose and schedule. I will check her counts in 2 months and a visit in 4 months. 2. Hypertension managed by Dr. Gershon Crane  3. Facial erythema: Resolved.      Ketsia Linebaugh 12/2/20149:37 AM

## 2013-09-27 NOTE — Telephone Encounter (Signed)
Gave pt appt for lab and MD for FebruAry and April 2015

## 2013-10-03 ENCOUNTER — Other Ambulatory Visit: Payer: Medicare Other | Admitting: Lab

## 2013-10-17 ENCOUNTER — Other Ambulatory Visit: Payer: Self-pay | Admitting: Oncology

## 2013-11-28 ENCOUNTER — Other Ambulatory Visit (HOSPITAL_BASED_OUTPATIENT_CLINIC_OR_DEPARTMENT_OTHER): Payer: Medicare PPO

## 2013-11-28 DIAGNOSIS — D47Z9 Other specified neoplasms of uncertain behavior of lymphoid, hematopoietic and related tissue: Secondary | ICD-10-CM

## 2013-11-28 DIAGNOSIS — D471 Chronic myeloproliferative disease: Secondary | ICD-10-CM

## 2013-11-28 LAB — CBC WITH DIFFERENTIAL/PLATELET
BASO%: 1.1 % (ref 0.0–2.0)
Basophils Absolute: 0.2 10*3/uL — ABNORMAL HIGH (ref 0.0–0.1)
EOS%: 5.2 % (ref 0.0–7.0)
Eosinophils Absolute: 0.8 10*3/uL — ABNORMAL HIGH (ref 0.0–0.5)
HCT: 34.5 % — ABNORMAL LOW (ref 34.8–46.6)
HGB: 11.5 g/dL — ABNORMAL LOW (ref 11.6–15.9)
LYMPH%: 12.8 % — AB (ref 14.0–49.7)
MCH: 28.2 pg (ref 25.1–34.0)
MCHC: 33.2 g/dL (ref 31.5–36.0)
MCV: 85 fL (ref 79.5–101.0)
MONO#: 1.4 10*3/uL — ABNORMAL HIGH (ref 0.1–0.9)
MONO%: 8.7 % (ref 0.0–14.0)
NEUT#: 11.4 10*3/uL — ABNORMAL HIGH (ref 1.5–6.5)
NEUT%: 72.2 % (ref 38.4–76.8)
Platelets: 214 10*3/uL (ref 145–400)
RBC: 4.06 10*6/uL (ref 3.70–5.45)
RDW: 15.1 % — ABNORMAL HIGH (ref 11.2–14.5)
WBC: 15.8 10*3/uL — ABNORMAL HIGH (ref 3.9–10.3)
lymph#: 2 10*3/uL (ref 0.9–3.3)

## 2013-11-28 LAB — COMPREHENSIVE METABOLIC PANEL (CC13)
ALK PHOS: 58 U/L (ref 40–150)
ALT: 9 U/L (ref 0–55)
AST: 22 U/L (ref 5–34)
Albumin: 3.5 g/dL (ref 3.5–5.0)
Anion Gap: 10 mEq/L (ref 3–11)
BUN: 18.4 mg/dL (ref 7.0–26.0)
CALCIUM: 10.8 mg/dL — AB (ref 8.4–10.4)
CO2: 25 mEq/L (ref 22–29)
CREATININE: 1 mg/dL (ref 0.6–1.1)
Chloride: 101 mEq/L (ref 98–109)
Glucose: 100 mg/dl (ref 70–140)
Potassium: 4.4 mEq/L (ref 3.5–5.1)
Sodium: 135 mEq/L — ABNORMAL LOW (ref 136–145)
Total Bilirubin: 0.58 mg/dL (ref 0.20–1.20)
Total Protein: 7.1 g/dL (ref 6.4–8.3)

## 2013-12-12 ENCOUNTER — Encounter: Payer: Self-pay | Admitting: Oncology

## 2013-12-13 ENCOUNTER — Telehealth: Payer: Self-pay | Admitting: Medical Oncology

## 2013-12-13 NOTE — Telephone Encounter (Signed)
Reviewed George Ina', patient's sister, email from 02/17 with MD and her concern with patient's mental digression. Per MD, informed Ms. Ricks this is not related to patient's blood. Ms. Marcia Brash states she will f/u with Dr Sarajane Jews, patient's PCP. Expressed thanks for f/u call. Encouraged Ms. Ricks to call office should she have any concerns or questions.

## 2013-12-16 ENCOUNTER — Encounter: Payer: Self-pay | Admitting: Family Medicine

## 2013-12-16 NOTE — Telephone Encounter (Signed)
Tell her sister that I suggest increasing the Aricept to the full dose at 10 mg daily and adding Namenda 10 mg BID. If she agrees, please call in one year of both of these

## 2013-12-21 ENCOUNTER — Telehealth: Payer: Self-pay | Admitting: Family Medicine

## 2013-12-21 NOTE — Telephone Encounter (Signed)
[id number] [place]  - [place] requesting refill of cloNIDine (CATAPRES) 0.2 MG tablet

## 2013-12-22 MED ORDER — LISINOPRIL 20 MG PO TABS: ORAL_TABLET | ORAL | Status: DC

## 2013-12-22 MED ORDER — ATENOLOL 50 MG PO TABS: ORAL_TABLET | ORAL | Status: DC

## 2013-12-22 MED ORDER — CLONIDINE HCL 0.2 MG PO TABS: ORAL_TABLET | ORAL | Status: DC

## 2013-12-22 NOTE — Telephone Encounter (Signed)
Rx sent to [facility] [place]

## 2013-12-22 NOTE — Telephone Encounter (Signed)
[id number] - [place], [place] requesting refills of the following:  lisinopril (PRINIVIL,ZESTRIL) 20 MG tablet cloNIDine (CATAPRES) 0.2 MG tablet atenolol (TENORMIN) 50 MG tablet

## 2013-12-31 ENCOUNTER — Other Ambulatory Visit: Payer: Self-pay | Admitting: Oncology

## 2014-01-26 ENCOUNTER — Encounter: Payer: Self-pay | Admitting: Oncology

## 2014-01-26 ENCOUNTER — Ambulatory Visit (HOSPITAL_BASED_OUTPATIENT_CLINIC_OR_DEPARTMENT_OTHER): Payer: Medicare PPO | Admitting: Oncology

## 2014-01-26 ENCOUNTER — Telehealth: Payer: Self-pay | Admitting: Oncology

## 2014-01-26 ENCOUNTER — Other Ambulatory Visit (HOSPITAL_BASED_OUTPATIENT_CLINIC_OR_DEPARTMENT_OTHER): Payer: Medicare PPO

## 2014-01-26 VITALS — BP 129/76 | HR 72 | Temp 97.4°F | Resp 20 | Ht 59.0 in | Wt 103.9 lb

## 2014-01-26 DIAGNOSIS — I10 Essential (primary) hypertension: Secondary | ICD-10-CM

## 2014-01-26 DIAGNOSIS — D471 Chronic myeloproliferative disease: Secondary | ICD-10-CM

## 2014-01-26 DIAGNOSIS — D47Z9 Other specified neoplasms of uncertain behavior of lymphoid, hematopoietic and related tissue: Secondary | ICD-10-CM

## 2014-01-26 DIAGNOSIS — F29 Unspecified psychosis not due to a substance or known physiological condition: Secondary | ICD-10-CM

## 2014-01-26 DIAGNOSIS — F0390 Unspecified dementia without behavioral disturbance: Secondary | ICD-10-CM

## 2014-01-26 LAB — COMPREHENSIVE METABOLIC PANEL (CC13)
ALBUMIN: 3.7 g/dL (ref 3.5–5.0)
ALK PHOS: 57 U/L (ref 40–150)
ALT: 7 U/L (ref 0–55)
AST: 18 U/L (ref 5–34)
Anion Gap: 9 mEq/L (ref 3–11)
BUN: 24 mg/dL (ref 7.0–26.0)
CALCIUM: 9.9 mg/dL (ref 8.4–10.4)
CO2: 22 mEq/L (ref 22–29)
Chloride: 103 mEq/L (ref 98–109)
Creatinine: 1 mg/dL (ref 0.6–1.1)
GLUCOSE: 96 mg/dL (ref 70–140)
POTASSIUM: 4.5 meq/L (ref 3.5–5.1)
Sodium: 134 mEq/L — ABNORMAL LOW (ref 136–145)
Total Bilirubin: 0.5 mg/dL (ref 0.20–1.20)
Total Protein: 7.1 g/dL (ref 6.4–8.3)

## 2014-01-26 LAB — CBC WITH DIFFERENTIAL/PLATELET
BASO%: 0.8 % (ref 0.0–2.0)
Basophils Absolute: 0.2 10*3/uL — ABNORMAL HIGH (ref 0.0–0.1)
EOS ABS: 0.6 10*3/uL — AB (ref 0.0–0.5)
EOS%: 3.3 % (ref 0.0–7.0)
HEMATOCRIT: 39 % (ref 34.8–46.6)
HEMOGLOBIN: 13.1 g/dL (ref 11.6–15.9)
LYMPH%: 17.8 % (ref 14.0–49.7)
MCH: 27.9 pg (ref 25.1–34.0)
MCHC: 33.6 g/dL (ref 31.5–36.0)
MCV: 83 fL (ref 79.5–101.0)
MONO#: 1.2 10*3/uL — ABNORMAL HIGH (ref 0.1–0.9)
MONO%: 6.3 % (ref 0.0–14.0)
NEUT%: 71.8 % (ref 38.4–76.8)
NEUTROS ABS: 13.5 10*3/uL — AB (ref 1.5–6.5)
PLATELETS: 261 10*3/uL (ref 145–400)
RBC: 4.7 10*6/uL (ref 3.70–5.45)
RDW: 15.5 % — AB (ref 11.2–14.5)
WBC: 18.8 10*3/uL — ABNORMAL HIGH (ref 3.9–10.3)
lymph#: 3.3 10*3/uL (ref 0.9–3.3)

## 2014-01-26 NOTE — Telephone Encounter (Signed)
gv pt appt schedule for august

## 2014-01-26 NOTE — Progress Notes (Signed)
Hematology and Oncology Follow Up Visit  Donna Webb [id number] Sep 22, 1941 73 y.o. 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 She was treated with hydrea till 08/2012 and switch to Anagrelide due to lack of response.   Current therapy: Anagrelide 0.5 mg BID started around 08/2012.  Interim History:  Donna Webb is seen today with her sister in accompaniment for followup of her myeloproliferative disorder. She is on Anagrelide and tolerating it well. She had a period of confusion last few weeks which has improved at this time with titrating her Aricept medication. There has been no apparent difficulty with fevers, chills or night sweats. No nausea, or emesis issues. No bleeding or bruising symptoms. She has not reported any lower extremity edema or any other complications. She's continued to have a reasonable quality of life without any deterioration.  Medications: I have reviewed the patient's current medications.  Current Outpatient Prescriptions  Medication Sig Dispense Refill  . anagrelide (AGRYLIN) 0.5 MG capsule TAKE 1 CAPSULE (0.5 MG TOTAL) BY MOUTH 2 (TWO) TIMES DAILY.  60 capsule  3  . anagrelide (AGRYLIN) 0.5 MG capsule TAKE 1 CAPSULE (0.5 MG TOTAL) BY MOUTH 2 (TWO) TIMES DAILY.  60 capsule  3  . aspirin 81 MG tablet Take 81 mg by mouth daily.      Marland Kitchen atenolol (TENORMIN) 50 MG tablet TAKE 1 TABLET (50 MG TOTAL) BY MOUTH 2 (TWO) TIMES DAILY.  180 tablet  0  . Calcium Carbonate-Vitamin D (CALCIUM-VITAMIN D) 500-200 MG-UNIT per tablet Take 1 tablet by mouth 2 (two) times daily with meals.        . cloNIDine (CATAPRES) 0.2 MG tablet TAKE 1 TABLET (0.2 MG TOTAL) BY MOUTH 2 (TWO) TIMES DAILY.  180 tablet  0  . donepezil (ARICEPT) 5 MG tablet Take 1 tablet (5 mg total) by mouth at bedtime as needed.  30 tablet  11  . lisinopril (PRINIVIL,ZESTRIL) 20 MG tablet TAKE 1  TABLET (20 MG TOTAL) BY MOUTH DAILY.  90 tablet  0  . Multiple Vitamin (MULTIVITAMIN) tablet Take 1 tablet by mouth daily.         No current facility-administered medications for this visit.     Allergies:  Allergies  Allergen Reactions  . Amlodipine Besylate     REACTION: intolerance  . Neosporin [Neomycin-Bacitracin Zn-Polymyx]     Rash     Past Medical History, Surgical history, Social history, and Family History were reviewed and updated.  Review of Systems:  Constitutional:  Negative for fever, chills, night sweats, anorexia, weight loss, pain. Cardiovascular: no chest pain or dyspnea on exertion Respiratory: no cough, shortness of breath, or wheezing Neurological: no TIA or stroke symptoms Dermatological: negative ENT: negative Skin: Negative. Gastrointestinal: no abdominal pain, change in bowel habits, or black or bloody stools Genito-Urinary: no dysuria, trouble voiding, or hematuria Hematological and Lymphatic: negative Breast: negative for breast lumps Musculoskeletal: negative Remaining ROS negative.  Physical Exam: Blood pressure 129/76, pulse 72, temperature 97.4 F (36.3 C), temperature source Oral, resp. rate 20, height 4\' 11"  (1.499 m), weight 103 lb 14.4 oz (47.129 kg). ECOG: 2 General appearance: alert, cooperative and no distress. Oriented to person and place. Head: Normocephalic, without obvious abnormality, atraumatic Neck: no adenopathy, no carotid bruit, no JVD, supple, symmetrical, trachea midline and thyroid not enlarged, symmetric, no tenderness/mass/nodules Lymph nodes: Cervical, supraclavicular, and axillary nodes normal. Heart:regular rate and rhythm, S1, S2  normal, no murmur, click, rub or gallop Lung:chest clear, no wheezing, rales, normal symmetric air entry, no tachypnea, retractions or cyanosis Abdomen: soft, non-tender, without masses or organomegaly EXT:no erythema, induration, or nodules   Lab Results: Lab Results  Component Value  Date   WBC 18.8* 01/26/2014   HGB 13.1 01/26/2014   HCT 39.0 01/26/2014   MCV 83.0 01/26/2014   PLT 261 01/26/2014     Chemistry      Component Value Date/Time   NA 135* 11/28/2013 0940   NA 139 12/16/2011 1013   K 4.4 11/28/2013 0940   K 4.7 12/16/2011 1013   CL 101 12/16/2011 1013   CO2 25 11/28/2013 0940   CO2 25 12/16/2011 1013   BUN 18.4 11/28/2013 0940   BUN 14 12/16/2011 1013   CREATININE 1.0 11/28/2013 0940   CREATININE 0.89 12/16/2011 1013      Component Value Date/Time   CALCIUM 10.8* 11/28/2013 0940   CALCIUM 11.4* 12/16/2011 1013   ALKPHOS 58 11/28/2013 0940   ALKPHOS 63 12/16/2011 1013   AST 22 11/28/2013 0940   AST 45* 12/16/2011 1013   ALT 9 11/28/2013 0940   ALT 36* 12/16/2011 1013   BILITOT 0.58 11/28/2013 0940   BILITOT 0.6 12/16/2011 1013      Impression and Plan:  Patient is a 73 year old woman with the following issues:  1. Myeloproliferative disorder currently on Anagrelide 0.5 mg per day. Today CBC showed excellent response without complication to the medication. The plan is to continue at the current dose and schedule. Her platelet counts remain within normal range and her white cell count elevation remain stable.  2. Hypertension managed by Dr. Alysia Webb   3. Episodic confusion: Likely related to her dementia which seems to be improving at this time.  4. Followup: Will be in 4 months.     [facility] 4/2/20159:42 AM

## 2014-01-27 ENCOUNTER — Other Ambulatory Visit: Payer: Self-pay | Admitting: Oncology

## 2014-02-16 ENCOUNTER — Encounter: Payer: Medicare PPO | Admitting: Family Medicine

## 2014-02-16 ENCOUNTER — Ambulatory Visit (INDEPENDENT_AMBULATORY_CARE_PROVIDER_SITE_OTHER): Payer: Medicare PPO | Admitting: Family Medicine

## 2014-02-16 ENCOUNTER — Encounter: Payer: Self-pay | Admitting: Family Medicine

## 2014-02-16 VITALS — BP 160/92 | HR 70 | Ht 59.25 in | Wt 104.0 lb

## 2014-02-16 DIAGNOSIS — E785 Hyperlipidemia, unspecified: Secondary | ICD-10-CM

## 2014-02-16 DIAGNOSIS — I10 Essential (primary) hypertension: Secondary | ICD-10-CM

## 2014-02-16 DIAGNOSIS — Z0000 Encounter for general adult medical examination without abnormal findings: Secondary | ICD-10-CM

## 2014-02-16 LAB — TSH: TSH: 0.82 u[IU]/mL (ref 0.35–5.50)

## 2014-02-16 LAB — LIPID PANEL
CHOL/HDL RATIO: 6
Cholesterol: 272 mg/dL — ABNORMAL HIGH (ref 0–200)
HDL: 47.8 mg/dL (ref >39.00)
LDL Cholesterol: 187 mg/dL — ABNORMAL HIGH (ref 0–99)
Triglycerides: 187 mg/dL — ABNORMAL HIGH (ref 0.0–149.0)
VLDL: 37.4 mg/dL (ref 0.0–40.0)

## 2014-02-16 MED ORDER — LISINOPRIL 20 MG PO TABS: ORAL_TABLET | ORAL | Status: DC

## 2014-02-16 MED ORDER — ATENOLOL 50 MG PO TABS: ORAL_TABLET | ORAL | Status: DC

## 2014-02-16 MED ORDER — DONEPEZIL HCL 10 MG PO TABS: 10.0000 mg | ORAL_TABLET | Freq: Every day | ORAL | Status: DC

## 2014-02-16 MED ORDER — MEMANTINE HCL 10 MG PO TABS: 10.0000 mg | ORAL_TABLET | Freq: Two times a day (BID) | ORAL | Status: DC

## 2014-02-16 MED ORDER — CLONIDINE HCL 0.2 MG PO TABS: ORAL_TABLET | ORAL | Status: DC

## 2014-02-16 NOTE — Progress Notes (Signed)
Pre visit review using our clinic review tool, if applicable. No additional management support is needed unless otherwise documented below in the visit note. 

## 2014-02-17 ENCOUNTER — Encounter: Payer: Self-pay | Admitting: Family Medicine

## 2014-02-17 NOTE — Progress Notes (Signed)
   Subjective:    Patient ID: Donna Webb, female    DOB: 02-04-1941, 73 y.o.   MRN: [id number]  HPI 73 yr old female here with her sister for a cpx. She is doing well lately with a good appetite. She has had some periods of decline as far as memory and she has periods of confusion. She has been on Aricept 5 mg a day for a couple of years now. Her sister takes care of her.    Review of Systems  Constitutional: Negative.   HENT: Negative.   Eyes: Negative.   Respiratory: Negative.   Cardiovascular: Negative.   Gastrointestinal: Negative.   Genitourinary: Negative for dysuria, urgency, frequency, hematuria, flank pain, decreased urine volume, enuresis, difficulty urinating, pelvic pain and dyspareunia.  Musculoskeletal: Negative.   Skin: Negative.   Neurological: Negative.   Psychiatric/Behavioral: Positive for confusion. Negative for hallucinations, behavioral problems, sleep disturbance, self-injury, dysphoric mood, decreased concentration and agitation. The patient is not nervous/anxious and is not hyperactive.        Objective:   Physical Exam  Constitutional: She is oriented to person, place, and time. She appears well-developed and well-nourished. No distress.  HENT:  Head: Normocephalic and atraumatic.  Right Ear: External ear normal.  Left Ear: External ear normal.  Nose: Nose normal.  Mouth/Throat: Oropharynx is clear and moist. No oropharyngeal exudate.  Eyes: Conjunctivae and EOM are normal. Pupils are equal, round, and reactive to light. No scleral icterus.  Neck: Normal range of motion. Neck supple. No JVD present. No thyromegaly present.  Cardiovascular: Normal rate, regular rhythm, normal heart sounds and intact distal pulses.  Exam reveals no gallop and no friction rub.   No murmur heard. Pulmonary/Chest: Effort normal and breath sounds normal. No respiratory distress. She has no wheezes. She has no rales. She exhibits no tenderness.  Abdominal: Soft. Bowel  sounds are normal. She exhibits no distension and no mass. There is no tenderness. There is no rebound and no guarding.  Musculoskeletal: Normal range of motion. She exhibits no edema and no tenderness.  Lymphadenopathy:    She has no cervical adenopathy.  Neurological: She is alert and oriented to person, place, and time. She has normal reflexes. No cranial nerve deficit. She exhibits normal muscle tone. Coordination normal.  Skin: Skin is warm and dry. No rash noted. No erythema.  Psychiatric: She has a normal mood and affect. Her behavior is normal. Judgment and thought content normal.          Assessment & Plan:  Well exam. Get labs today. We will increase Aricept to 10 mg daily and add Namenda 10 mg bid.

## 2014-05-15 ENCOUNTER — Telehealth: Payer: Self-pay | Admitting: Family Medicine

## 2014-05-15 NOTE — Telephone Encounter (Signed)
[id number] - [place] [employer], [place] is requesting a re-fill on donepezil (ARICEPT) 10 MG tablet

## 2014-05-15 NOTE — Telephone Encounter (Signed)
I spoke with pharmacy and pt has plenty of refills.

## 2014-05-30 ENCOUNTER — Telehealth: Payer: Self-pay | Admitting: Oncology

## 2014-05-30 ENCOUNTER — Other Ambulatory Visit (HOSPITAL_BASED_OUTPATIENT_CLINIC_OR_DEPARTMENT_OTHER): Payer: Medicare PPO

## 2014-05-30 ENCOUNTER — Encounter: Payer: Self-pay | Admitting: Oncology

## 2014-05-30 ENCOUNTER — Ambulatory Visit (HOSPITAL_BASED_OUTPATIENT_CLINIC_OR_DEPARTMENT_OTHER): Payer: Medicare PPO | Admitting: Oncology

## 2014-05-30 VITALS — BP 152/80 | HR 72 | Temp 97.8°F | Resp 18 | Ht 59.25 in | Wt 113.7 lb

## 2014-05-30 DIAGNOSIS — D471 Chronic myeloproliferative disease: Secondary | ICD-10-CM

## 2014-05-30 DIAGNOSIS — D47Z9 Other specified neoplasms of uncertain behavior of lymphoid, hematopoietic and related tissue: Secondary | ICD-10-CM

## 2014-05-30 LAB — COMPREHENSIVE METABOLIC PANEL (CC13)
ALT: 7 U/L (ref 0–55)
ANION GAP: 9 meq/L (ref 3–11)
AST: 19 U/L (ref 5–34)
Albumin: 3.6 g/dL (ref 3.5–5.0)
Alkaline Phosphatase: 53 U/L (ref 40–150)
BILIRUBIN TOTAL: 0.45 mg/dL (ref 0.20–1.20)
BUN: 21.1 mg/dL (ref 7.0–26.0)
CALCIUM: 9.9 mg/dL (ref 8.4–10.4)
CHLORIDE: 107 meq/L (ref 98–109)
CO2: 26 mEq/L (ref 22–29)
CREATININE: 1 mg/dL (ref 0.6–1.1)
GLUCOSE: 104 mg/dL (ref 70–140)
Potassium: 4.1 mEq/L (ref 3.5–5.1)
Sodium: 142 mEq/L (ref 136–145)
Total Protein: 7.1 g/dL (ref 6.4–8.3)

## 2014-05-30 LAB — CBC WITH DIFFERENTIAL/PLATELET
BASO%: 1.3 % (ref 0.0–2.0)
BASOS ABS: 0.3 10*3/uL — AB (ref 0.0–0.1)
EOS ABS: 0.7 10*3/uL — AB (ref 0.0–0.5)
EOS%: 3.4 % (ref 0.0–7.0)
HEMATOCRIT: 37.2 % (ref 34.8–46.6)
HEMOGLOBIN: 12 g/dL (ref 11.6–15.9)
LYMPH#: 3 10*3/uL (ref 0.9–3.3)
LYMPH%: 15 % (ref 14.0–49.7)
MCH: 27.5 pg (ref 25.1–34.0)
MCHC: 32.3 g/dL (ref 31.5–36.0)
MCV: 85.1 fL (ref 79.5–101.0)
MONO#: 1 10*3/uL — AB (ref 0.1–0.9)
MONO%: 4.8 % (ref 0.0–14.0)
NEUT%: 75.5 % (ref 38.4–76.8)
NEUTROS ABS: 15.2 10*3/uL — AB (ref 1.5–6.5)
Platelets: 489 10*3/uL — ABNORMAL HIGH (ref 145–400)
RBC: 4.37 10*6/uL (ref 3.70–5.45)
RDW: 16 % — AB (ref 11.2–14.5)
WBC: 20.2 10*3/uL — AB (ref 3.9–10.3)

## 2014-05-30 NOTE — Progress Notes (Signed)
Hematology and Oncology Follow Up Visit  Donna Webb [id number] 1941-01-26 73 y.o. 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 She was treated with hydrea till 08/2012 and switch to Anagrelide due to lack of response.   Current therapy: Anagrelide 0.5 mg BID started around 08/2012.  Interim History: Donna Webb is here for a follow up visit with her sister. She is on Anagrelide and tolerating it well. She she has been started on Namenda and have helped with her dementia considerably. She is reporting that her verbal communication and improved memory. There has been no fevers, chills or night sweats. She has not reported any chest pain or difficulty breathing. She reports no GI symptoms No nausea, or emesis issues. No bleeding or bruising symptoms. She has not reported any lower extremity edema or any other complications. She's continued to have a reasonable quality of life she has not reported any urinary symptoms or so her review of systems unremarkable.  Medications: I have reviewed the patient's current medications.  Current Outpatient Prescriptions  Medication Sig Dispense Refill  . anagrelide (AGRYLIN) 0.5 MG capsule TAKE 1 CAPSULE (0.5 MG TOTAL) BY MOUTH 2 (TWO) TIMES DAILY.  60 capsule  3  . anagrelide (AGRYLIN) 0.5 MG capsule TAKE 1 CAPSULE (0.5 MG TOTAL) BY MOUTH 2 (TWO) TIMES DAILY.  60 capsule  3  . anagrelide (AGRYLIN) 0.5 MG capsule TAKE 1 CAPSULE (0.5 MG TOTAL) BY MOUTH 2 (TWO) TIMES DAILY.  60 capsule  3  . aspirin 81 MG tablet Take 81 mg by mouth daily.      Marland Kitchen atenolol (TENORMIN) 50 MG tablet TAKE 1 TABLET (50 MG TOTAL) BY MOUTH 2 (TWO) TIMES DAILY.  180 tablet  3  . Calcium Carbonate-Vitamin D (CALCIUM-VITAMIN D) 500-200 MG-UNIT per tablet Take 1 tablet by mouth 2 (two) times daily with meals.        . cloNIDine (CATAPRES) 0.2 MG tablet TAKE 1 TABLET (0.2 MG  TOTAL) BY MOUTH 2 (TWO) TIMES DAILY.  180 tablet  3  . donepezil (ARICEPT) 10 MG tablet Take 1 tablet (10 mg total) by mouth at bedtime.  90 tablet  3  . lisinopril (PRINIVIL,ZESTRIL) 20 MG tablet TAKE 1 TABLET (20 MG TOTAL) BY MOUTH DAILY.  90 tablet  3  . memantine (NAMENDA) 10 MG tablet Take 1 tablet (10 mg total) by mouth 2 (two) times daily.  180 tablet  3  . Multiple Vitamin (MULTIVITAMIN) tablet Take 1 tablet by mouth daily.         No current facility-administered medications for this visit.     Allergies:  Allergies  Allergen Reactions  . Amlodipine Besylate     REACTION: intolerance  . Neosporin [Neomycin-Bacitracin Zn-Polymyx]     Rash     Past Medical History, Surgical history, Social history, and Family History were reviewed and updated.    Physical Exam: Blood pressure 152/80, pulse 72, temperature 97.8 F (36.6 C), temperature source Oral, resp. rate 18, height 4' 11.25" (1.505 m), weight 113 lb 11.2 oz (51.574 kg), SpO2 99.00%. ECOG: 2 General appearance: alert and cooperative. Oriented to person and place. Head: Normocephalic, without obvious abnormality, atraumatic Neck: no adenopathy Lymph nodes: Cervical, supraclavicular, and axillary nodes normal. Heart:regular rate and rhythm, S1, S2 normal, no murmur, click, rub or gallop Lung:chest clear, no wheezing, rales, normal symmetric air entry. Abdomen: soft, non-tender, without masses or organomegaly EXT:no erythema, induration,  or nodules   Lab Results: Lab Results  Component Value Date   WBC 20.2* 05/30/2014   HGB 12.0 05/30/2014   HCT 37.2 05/30/2014   MCV 85.1 05/30/2014   PLT 489* 05/30/2014     Chemistry      Component Value Date/Time   NA 134* 01/26/2014 0903   NA 139 12/16/2011 1013   K 4.5 01/26/2014 0903   K 4.7 12/16/2011 1013   CL 101 12/16/2011 1013   CO2 22 01/26/2014 0903   CO2 25 12/16/2011 1013   BUN 24.0 01/26/2014 0903   BUN 14 12/16/2011 1013   CREATININE 1.0 01/26/2014 0903   CREATININE 0.89  12/16/2011 1013      Component Value Date/Time   CALCIUM 9.9 01/26/2014 0903   CALCIUM 11.4* 12/16/2011 1013   ALKPHOS 57 01/26/2014 0903   ALKPHOS 63 12/16/2011 1013   AST 18 01/26/2014 0903   AST 45* 12/16/2011 1013   ALT 7 01/26/2014 0903   ALT 36* 12/16/2011 1013   BILITOT 0.50 01/26/2014 0903   BILITOT 0.6 12/16/2011 1013      Impression and Plan:  Patient is a 73 year old woman with the following issues:  1. Myeloproliferative disorder currently on Anagrelide 0.5 mg per day. Her platelet count today slightly more elevated than previously but still well within control. I plan on continuing the current dose and schedule without any modification. As long as her platelets below 600 I will not make any adjustments.  2. Hypertension managed by Dr. Alysia Penna   3. Dementia: She is currently on Namenda and have improved her symptoms considerably.  4. Followup: Will be in 3 months.     SHADAD,FIRAS 8/4/20158:34 AM

## 2014-05-30 NOTE — Telephone Encounter (Signed)
Pt confirmed labs/ov per 08/04 POF, gave pt AVS.....KJ °

## 2014-07-31 ENCOUNTER — Ambulatory Visit: Payer: Self-pay | Admitting: Family Medicine

## 2014-08-29 ENCOUNTER — Ambulatory Visit (INDEPENDENT_AMBULATORY_CARE_PROVIDER_SITE_OTHER): Payer: Medicare PPO | Admitting: Family Medicine

## 2014-08-29 ENCOUNTER — Encounter: Payer: Self-pay | Admitting: Family Medicine

## 2014-08-29 VITALS — BP 159/102 | HR 69 | Temp 98.1°F | Ht 59.25 in | Wt 116.0 lb

## 2014-08-29 DIAGNOSIS — Z23 Encounter for immunization: Secondary | ICD-10-CM

## 2014-08-29 DIAGNOSIS — Z0000 Encounter for general adult medical examination without abnormal findings: Secondary | ICD-10-CM

## 2014-08-29 NOTE — Progress Notes (Signed)
   Subjective:    Patient ID: Donna Webb, female    DOB: [birth year], 73 y.o.   MRN: [id number]  HPI 73 yr old female for a cpx. She feels well and her sister says she is doing well. Her mind is more clear than it has been for several years. She has an improved appetite. She is interested in returning to the [facility] for exercise again.    Review of Systems  Constitutional: Negative.   HENT: Negative.   Eyes: Negative.   Respiratory: Negative.   Cardiovascular: Negative.   Gastrointestinal: Negative.   Genitourinary: Negative for dysuria, urgency, frequency, hematuria, flank pain, decreased urine volume, enuresis, difficulty urinating, pelvic pain and dyspareunia.  Musculoskeletal: Negative.   Skin: Negative.   Neurological: Negative.   Psychiatric/Behavioral: Negative.        Objective:   Physical Exam  Constitutional: She is oriented to person, place, and time. She appears well-developed and well-nourished. No distress.  HENT:  Head: Normocephalic and atraumatic.  Right Ear: External ear normal.  Left Ear: External ear normal.  Nose: Nose normal.  Mouth/Throat: Oropharynx is clear and moist. No oropharyngeal exudate.  Eyes: Conjunctivae and EOM are normal. Pupils are equal, round, and reactive to light. No scleral icterus.  Neck: Normal range of motion. Neck supple. No JVD present. No thyromegaly present.  Cardiovascular: Normal rate, regular rhythm, normal heart sounds and intact distal pulses.  Exam reveals no gallop and no friction rub.   No murmur heard. Pulmonary/Chest: Effort normal and breath sounds normal. No respiratory distress. She has no wheezes. She has no rales. She exhibits no tenderness.  Abdominal: Soft. Bowel sounds are normal. She exhibits no distension and no mass. There is no tenderness. There is no rebound and no guarding.  Musculoskeletal: Normal range of motion. She exhibits no edema or tenderness.  Lymphadenopathy:    She has no cervical adenopathy.    Neurological: She is alert and oriented to person, place, and time. She has normal reflexes. No cranial nerve deficit. She exhibits normal muscle tone. Coordination normal.  Skin: Skin is warm and dry. No rash noted. No erythema.  Psychiatric: She has a normal mood and affect. Her behavior is normal. Judgment and thought content normal.          Assessment & Plan:  Well exam. Set up for fasting labs. She has a hx of "white coat HTN" but they have not checked her BP at home for a long time. I asked them to check this twice a day for the next 2 weeks and to then give me a report. We will adjust her meds if needed

## 2014-08-29 NOTE — Progress Notes (Signed)
Pre visit review using our clinic review tool, if applicable. No additional management support is needed unless otherwise documented below in the visit note. 

## 2014-08-30 ENCOUNTER — Ambulatory Visit (HOSPITAL_BASED_OUTPATIENT_CLINIC_OR_DEPARTMENT_OTHER): Payer: Medicare PPO | Admitting: Oncology

## 2014-08-30 ENCOUNTER — Telehealth: Payer: Self-pay | Admitting: Oncology

## 2014-08-30 ENCOUNTER — Other Ambulatory Visit (HOSPITAL_BASED_OUTPATIENT_CLINIC_OR_DEPARTMENT_OTHER): Payer: Medicare PPO

## 2014-08-30 VITALS — BP 131/77 | HR 79 | Temp 97.6°F | Resp 17 | Ht 59.0 in | Wt 115.3 lb

## 2014-08-30 DIAGNOSIS — I10 Essential (primary) hypertension: Secondary | ICD-10-CM

## 2014-08-30 DIAGNOSIS — D471 Chronic myeloproliferative disease: Secondary | ICD-10-CM

## 2014-08-30 DIAGNOSIS — C946 Myelodysplastic disease, not classified: Secondary | ICD-10-CM

## 2014-08-30 DIAGNOSIS — F0390 Unspecified dementia without behavioral disturbance: Secondary | ICD-10-CM

## 2014-08-30 LAB — CBC WITH DIFFERENTIAL/PLATELET
BASO%: 0.6 % (ref 0.0–2.0)
BASOS ABS: 0.1 10*3/uL (ref 0.0–0.1)
EOS%: 3.3 % (ref 0.0–7.0)
Eosinophils Absolute: 0.6 10*3/uL — ABNORMAL HIGH (ref 0.0–0.5)
HCT: 38.4 % (ref 34.8–46.6)
HEMOGLOBIN: 12.7 g/dL (ref 11.6–15.9)
LYMPH#: 2.1 10*3/uL (ref 0.9–3.3)
LYMPH%: 11.1 % — ABNORMAL LOW (ref 14.0–49.7)
MCH: 27.3 pg (ref 25.1–34.0)
MCHC: 33.1 g/dL (ref 31.5–36.0)
MCV: 82.6 fL (ref 79.5–101.0)
MONO#: 1.2 10*3/uL — AB (ref 0.1–0.9)
MONO%: 6.3 % (ref 0.0–14.0)
NEUT#: 15 10*3/uL — ABNORMAL HIGH (ref 1.5–6.5)
NEUT%: 78.7 % — ABNORMAL HIGH (ref 38.4–76.8)
Platelets: 434 10*3/uL — ABNORMAL HIGH (ref 145–400)
RBC: 4.65 10*6/uL (ref 3.70–5.45)
RDW: 15 % — ABNORMAL HIGH (ref 11.2–14.5)
WBC: 19 10*3/uL — AB (ref 3.9–10.3)

## 2014-08-30 LAB — COMPREHENSIVE METABOLIC PANEL (CC13)
ALBUMIN: 3.7 g/dL (ref 3.5–5.0)
ALT: 9 U/L (ref 0–55)
AST: 19 U/L (ref 5–34)
Alkaline Phosphatase: 52 U/L (ref 40–150)
Anion Gap: 12 mEq/L — ABNORMAL HIGH (ref 3–11)
BUN: 21.7 mg/dL (ref 7.0–26.0)
CALCIUM: 10.4 mg/dL (ref 8.4–10.4)
CHLORIDE: 106 meq/L (ref 98–109)
CO2: 23 meq/L (ref 22–29)
Creatinine: 1.2 mg/dL — ABNORMAL HIGH (ref 0.6–1.1)
Glucose: 111 mg/dl (ref 70–140)
Potassium: 4 mEq/L (ref 3.5–5.1)
Sodium: 141 mEq/L (ref 136–145)
Total Bilirubin: 0.49 mg/dL (ref 0.20–1.20)
Total Protein: 6.9 g/dL (ref 6.4–8.3)

## 2014-08-30 NOTE — Telephone Encounter (Signed)
gv adn printed appt sched and avs for pt for Feb 2016 °

## 2014-08-30 NOTE — Progress Notes (Signed)
Hematology and Oncology Follow Up Visit  Donna Webb [id number] 26-Sep-1941 73 y.o. 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 She was treated with hydrea till 08/2012 and switch to Anagrelide due to lack of response.   Current therapy: Anagrelide 0.5 mg BID started around 08/2012.  Interim History: Donna Webb presents here for a follow up visit with her sister. Since the last visit, she reports no new issues. She continues to tolerate anagrelide without any complications.There has been no fevers, chills or night sweats. She has not reported any chest pain or difficulty breathing. She reports no GI symptoms No nausea, or emesis issues. No bleeding or bruising symptoms. She has not reported any lower extremity edema or any other complications. She's continued to have a reasonable quality of life she has not reported any urinary symptoms. Her appetite has been excellent and blood pressure has been under control. Rest of her review of systems unremarkable.  Medications: I have reviewed the patient's current medications.  Current Outpatient Prescriptions  Medication Sig Dispense Refill  . anagrelide (AGRYLIN) 0.5 MG capsule TAKE 1 CAPSULE (0.5 MG TOTAL) BY MOUTH 2 (TWO) TIMES DAILY. 60 capsule 3  . anagrelide (AGRYLIN) 0.5 MG capsule TAKE 1 CAPSULE (0.5 MG TOTAL) BY MOUTH 2 (TWO) TIMES DAILY. 60 capsule 3  . anagrelide (AGRYLIN) 0.5 MG capsule TAKE 1 CAPSULE (0.5 MG TOTAL) BY MOUTH 2 (TWO) TIMES DAILY. 60 capsule 3  . aspirin 81 MG tablet Take 81 mg by mouth daily.    Marland Kitchen atenolol (TENORMIN) 50 MG tablet TAKE 1 TABLET (50 MG TOTAL) BY MOUTH 2 (TWO) TIMES DAILY. 180 tablet 3  . Calcium Carbonate-Vitamin D (CALCIUM-VITAMIN D) 500-200 MG-UNIT per tablet Take 1 tablet by mouth 2 (two) times daily with meals.      . cloNIDine (CATAPRES) 0.2 MG tablet TAKE 1 TABLET (0.2 MG TOTAL) BY  MOUTH 2 (TWO) TIMES DAILY. 180 tablet 3  . donepezil (ARICEPT) 10 MG tablet Take 1 tablet (10 mg total) by mouth at bedtime. 90 tablet 3  . lisinopril (PRINIVIL,ZESTRIL) 20 MG tablet TAKE 1 TABLET (20 MG TOTAL) BY MOUTH DAILY. 90 tablet 3  . memantine (NAMENDA) 10 MG tablet Take 1 tablet (10 mg total) by mouth 2 (two) times daily. 180 tablet 3  . Multiple Vitamin (MULTIVITAMIN) tablet Take 1 tablet by mouth daily.       No current facility-administered medications for this visit.     Allergies:  Allergies  Allergen Reactions  . Amlodipine Besylate     REACTION: intolerance  . Neosporin [Neomycin-Bacitracin Zn-Polymyx]     Rash     Past Medical History, Surgical history, Social history, and Family History were reviewed and updated.    Physical Exam: Blood pressure 131/77, pulse 79, temperature 97.6 F (36.4 C), temperature source Oral, resp. rate 17, height 4\' 11"  (1.499 m), weight 115 lb 4.8 oz (52.3 kg), SpO2 99 %. ECOG: 2 General appearance: alert and cooperative.  Head: Normocephalic, without obvious abnormality, atraumatic Neck: no adenopathy Lymph nodes: Cervical, supraclavicular, and axillary nodes normal. Heart:regular rate and rhythm, S1, S2 normal, no murmur, click, rub or gallop Lung:chest clear, no wheezing, rales, normal symmetric air entry. Abdomen: soft, non-tender, without masses or organomegaly EXT:no erythema, induration, or nodules   Lab Results: Lab Results  Component Value Date   WBC 19.0* 08/30/2014   HGB 12.7 08/30/2014   HCT 38.4 08/30/2014  MCV 82.6 08/30/2014   PLT 434* 08/30/2014     Chemistry      Component Value Date/Time   NA 142 05/30/2014 0808   NA 139 12/16/2011 1013   K 4.1 05/30/2014 0808   K 4.7 12/16/2011 1013   CL 101 12/16/2011 1013   CO2 26 05/30/2014 0808   CO2 25 12/16/2011 1013   BUN 21.1 05/30/2014 0808   BUN 14 12/16/2011 1013   CREATININE 1.0 05/30/2014 0808   CREATININE 0.89 12/16/2011 1013      Component  Value Date/Time   CALCIUM 9.9 05/30/2014 0808   CALCIUM 11.4* 12/16/2011 1013   ALKPHOS 53 05/30/2014 0808   ALKPHOS 63 12/16/2011 1013   AST 19 05/30/2014 0808   AST 45* 12/16/2011 1013   ALT 7 05/30/2014 0808   ALT 36* 12/16/2011 1013   BILITOT 0.45 05/30/2014 0808   BILITOT 0.6 12/16/2011 1013      Impression and Plan:  Patient is a 73 year old woman with the following issues:  1. Myeloproliferative disorder currently on Anagrelide 0.5 mg per day. Her platelet count today is improved to 434 and continues to be under control. I plan on continuing the current dose and schedule without any modification. As long as her platelets below 600 I will not make any adjustments.  2. Hypertension managed by Dr. Alysia Penna   3. Dementia: She is currently on Namenda and have improved her symptoms considerably.  4. Followup: Will be in 3 months.     Yong Grieser 11/4/20159:07 AM

## 2014-09-05 ENCOUNTER — Other Ambulatory Visit: Payer: Self-pay | Admitting: Oncology

## 2014-09-28 ENCOUNTER — Other Ambulatory Visit: Payer: Self-pay | Admitting: Family Medicine

## 2014-09-28 DIAGNOSIS — Z1231 Encounter for screening mammogram for malignant neoplasm of breast: Secondary | ICD-10-CM

## 2014-11-07 ENCOUNTER — Ambulatory Visit (HOSPITAL_COMMUNITY): Payer: Medicare PPO

## 2014-11-29 ENCOUNTER — Telehealth: Payer: Self-pay | Admitting: Oncology

## 2014-11-29 ENCOUNTER — Other Ambulatory Visit (HOSPITAL_BASED_OUTPATIENT_CLINIC_OR_DEPARTMENT_OTHER): Payer: Medicare PPO

## 2014-11-29 ENCOUNTER — Ambulatory Visit (HOSPITAL_BASED_OUTPATIENT_CLINIC_OR_DEPARTMENT_OTHER): Payer: Medicare PPO | Admitting: Oncology

## 2014-11-29 VITALS — BP 187/92 | HR 65 | Temp 97.9°F | Resp 18 | Ht 59.0 in | Wt 118.8 lb

## 2014-11-29 DIAGNOSIS — D47Z9 Other specified neoplasms of uncertain behavior of lymphoid, hematopoietic and related tissue: Secondary | ICD-10-CM

## 2014-11-29 DIAGNOSIS — D471 Chronic myeloproliferative disease: Secondary | ICD-10-CM

## 2014-11-29 DIAGNOSIS — I10 Essential (primary) hypertension: Secondary | ICD-10-CM

## 2014-11-29 DIAGNOSIS — F0390 Unspecified dementia without behavioral disturbance: Secondary | ICD-10-CM

## 2014-11-29 LAB — CBC WITH DIFFERENTIAL/PLATELET
BASO%: 0.8 % (ref 0.0–2.0)
Basophils Absolute: 0.1 10*3/uL (ref 0.0–0.1)
EOS ABS: 0.6 10*3/uL — AB (ref 0.0–0.5)
EOS%: 3 % (ref 0.0–7.0)
HCT: 38.5 % (ref 34.8–46.6)
HEMOGLOBIN: 12.6 g/dL (ref 11.6–15.9)
LYMPH%: 15 % (ref 14.0–49.7)
MCH: 27.4 pg (ref 25.1–34.0)
MCHC: 32.7 g/dL (ref 31.5–36.0)
MCV: 83.7 fL (ref 79.5–101.0)
MONO#: 1 10*3/uL — ABNORMAL HIGH (ref 0.1–0.9)
MONO%: 5.6 % (ref 0.0–14.0)
NEUT%: 75.6 % (ref 38.4–76.8)
NEUTROS ABS: 13.9 10*3/uL — AB (ref 1.5–6.5)
Platelets: 466 10*3/uL — ABNORMAL HIGH (ref 145–400)
RBC: 4.6 10*6/uL (ref 3.70–5.45)
RDW: 16.3 % — AB (ref 11.2–14.5)
WBC: 18.4 10*3/uL — ABNORMAL HIGH (ref 3.9–10.3)
lymph#: 2.8 10*3/uL (ref 0.9–3.3)

## 2014-11-29 LAB — COMPREHENSIVE METABOLIC PANEL (CC13)
ALBUMIN: 3.6 g/dL (ref 3.5–5.0)
ALK PHOS: 58 U/L (ref 40–150)
ALT: 7 U/L (ref 0–55)
AST: 17 U/L (ref 5–34)
Anion Gap: 9 mEq/L (ref 3–11)
BUN: 16.9 mg/dL (ref 7.0–26.0)
CALCIUM: 9.3 mg/dL (ref 8.4–10.4)
CO2: 25 meq/L (ref 22–29)
CREATININE: 1.1 mg/dL (ref 0.6–1.1)
Chloride: 109 mEq/L (ref 98–109)
EGFR: 51 mL/min/{1.73_m2} — ABNORMAL LOW (ref >90)
Glucose: 101 mg/dl (ref 70–140)
Potassium: 4.3 mEq/L (ref 3.5–5.1)
Sodium: 143 mEq/L (ref 136–145)
Total Bilirubin: 0.42 mg/dL (ref 0.20–1.20)
Total Protein: 6.9 g/dL (ref 6.4–8.3)

## 2014-11-29 NOTE — Telephone Encounter (Signed)
Pt confirmed labs/ov per 02/03 POF, gave pt AVS.... KJ °

## 2014-11-29 NOTE — Progress Notes (Signed)
Hematology and Oncology Follow Up Visit  Donna Webb [id number] 01-18-1941 74 y.o. 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 She was treated with hydrea till 08/2012 and switch to Anagrelide due to lack of response.   Current therapy: Anagrelide 0.5 mg BID started around 08/2012.  Interim History: Donna Webb presents here for a follow up visit with her sister. Since the last visit, she continues to do well. She continues to have occasional memory issues that have been stable overall. Her blood pressure is under control when she takes it and she has not taken her blood pressure medication this morning. She continues to tolerate anagrelide without any complications.There has been no fevers, chills or night sweats. She has not reported any chest pain or difficulty breathing. She reports no GI symptoms No nausea, or emesis issues. No bleeding or bruising symptoms. She has not reported any lower extremity edema or any other complications. She's continued to have a reasonable quality of life she has not reported any urinary symptoms. Her appetite has been excellent and blood pressure has been under control. Rest of her review of systems unremarkable.  Medications: I have reviewed the patient's current medications.  Current Outpatient Prescriptions  Medication Sig Dispense Refill  . anagrelide (AGRYLIN) 0.5 MG capsule TAKE 1 CAPSULE (0.5 MG TOTAL) BY MOUTH 2 (TWO) TIMES DAILY. 60 capsule 3  . anagrelide (AGRYLIN) 0.5 MG capsule TAKE 1 CAPSULE (0.5 MG TOTAL) BY MOUTH 2 (TWO) TIMES DAILY. 60 capsule 3  . anagrelide (AGRYLIN) 0.5 MG capsule TAKE 1 CAPSULE (0.5 MG TOTAL) BY MOUTH 2 (TWO) TIMES DAILY. 60 capsule 3  . aspirin 81 MG tablet Take 81 mg by mouth daily.    Marland Kitchen atenolol (TENORMIN) 50 MG tablet TAKE 1 TABLET (50 MG TOTAL) BY MOUTH 2 (TWO) TIMES DAILY. 180 tablet 3  . Calcium  Carbonate-Vitamin D (CALCIUM-VITAMIN D) 500-200 MG-UNIT per tablet Take 1 tablet by mouth 2 (two) times daily with meals.      . cloNIDine (CATAPRES) 0.2 MG tablet TAKE 1 TABLET (0.2 MG TOTAL) BY MOUTH 2 (TWO) TIMES DAILY. 180 tablet 3  . donepezil (ARICEPT) 10 MG tablet Take 1 tablet (10 mg total) by mouth at bedtime. 90 tablet 3  . lisinopril (PRINIVIL,ZESTRIL) 20 MG tablet TAKE 1 TABLET (20 MG TOTAL) BY MOUTH DAILY. 90 tablet 3  . memantine (NAMENDA) 10 MG tablet Take 1 tablet (10 mg total) by mouth 2 (two) times daily. 180 tablet 3  . Multiple Vitamin (MULTIVITAMIN) tablet Take 1 tablet by mouth daily.       No current facility-administered medications for this visit.     Allergies:  Allergies  Allergen Reactions  . Amlodipine Besylate     REACTION: intolerance  . Neosporin [Neomycin-Bacitracin Zn-Polymyx] Rash    Rash     Past Medical History, Surgical history, Social history, and Family History were reviewed and updated.    Physical Exam: Blood pressure 187/92, pulse 65, temperature 97.9 F (36.6 C), temperature source Oral, resp. rate 18, height 4\' 11"  (1.499 m), weight 118 lb 12.8 oz (53.887 kg), SpO2 100 %. ECOG: 2 General appearance: alert and cooperative. Not in any distress. Head: Normocephalic, without obvious abnormality Neck: no adenopathy Lymph nodes: Cervical, supraclavicular, and axillary nodes normal. Heart:regular rate and rhythm, S1, S2 normal, no murmur, click, rub or gallop Lung:chest clear, no wheezing, rales, normal symmetric air entry. Abdomen: soft, non-tender,  without masses or organomegaly EXT:no erythema, induration, or nodules   Lab Results: Lab Results  Component Value Date   WBC 18.4* 11/29/2014   HGB 12.6 11/29/2014   HCT 38.5 11/29/2014   MCV 83.7 11/29/2014   PLT 466* 11/29/2014     Chemistry      Component Value Date/Time   NA 141 08/30/2014 0835   NA 139 12/16/2011 1013   K 4.0 08/30/2014 0835   K 4.7 12/16/2011 1013   CL 101  12/16/2011 1013   CO2 23 08/30/2014 0835   CO2 25 12/16/2011 1013   BUN 21.7 08/30/2014 0835   BUN 14 12/16/2011 1013   CREATININE 1.2* 08/30/2014 0835   CREATININE 0.89 12/16/2011 1013      Component Value Date/Time   CALCIUM 10.4 08/30/2014 0835   CALCIUM 11.4* 12/16/2011 1013   ALKPHOS 52 08/30/2014 0835   ALKPHOS 63 12/16/2011 1013   AST 19 08/30/2014 0835   AST 45* 12/16/2011 1013   ALT 9 08/30/2014 0835   ALT 36* 12/16/2011 1013   BILITOT 0.49 08/30/2014 0835   BILITOT 0.6 12/16/2011 1013      Impression and Plan:  Patient is a 74 year old woman with the following issues:  1. Myeloproliferative disorder currently on Anagrelide 0.5 mg per day. Her platelet count today is relatively stable compared to previous counts. I plan on continuing the current dose and schedule without any modification. As long as her platelets below 600 I will not make any adjustments.  2. Hypertension managed by Dr. Alysia Penna. Her blood pressure slightly elevated today but she did not take her blood pressure medication. Her blood pressure has been reasonably under control per her report.  3. Dementia: She is currently on Namenda and have improved her symptoms considerably.  4. Followup: Will be in 3 months.     [facility] 2/3/20169:02 AM

## 2015-01-01 ENCOUNTER — Ambulatory Visit (HOSPITAL_COMMUNITY)
Admission: RE | Admit: 2015-01-01 | Discharge: 2015-01-01 | Disposition: A | Discharge status: Home / Self Care | Payer: Medicare PPO | Source: Ambulatory Visit | Attending: Family Medicine | Admitting: Family Medicine

## 2015-01-01 DIAGNOSIS — Z1231 Encounter for screening mammogram for malignant neoplasm of breast: Secondary | ICD-10-CM | POA: Insufficient documentation

## 2015-01-23 ENCOUNTER — Other Ambulatory Visit: Payer: Self-pay | Admitting: Oncology

## 2015-01-31 ENCOUNTER — Other Ambulatory Visit: Payer: Self-pay | Admitting: Family Medicine

## 2015-02-01 NOTE — Telephone Encounter (Signed)
Okay per Dr. Sarajane Jews to refill.

## 2015-02-04 ENCOUNTER — Other Ambulatory Visit: Payer: Self-pay | Admitting: Family Medicine

## 2015-02-28 ENCOUNTER — Telehealth: Payer: Self-pay | Admitting: Oncology

## 2015-02-28 ENCOUNTER — Other Ambulatory Visit (HOSPITAL_BASED_OUTPATIENT_CLINIC_OR_DEPARTMENT_OTHER): Payer: Medicare PPO

## 2015-02-28 ENCOUNTER — Ambulatory Visit (HOSPITAL_BASED_OUTPATIENT_CLINIC_OR_DEPARTMENT_OTHER): Payer: Medicare PPO | Admitting: Oncology

## 2015-02-28 VITALS — BP 149/89 | HR 77 | Temp 97.7°F | Resp 16 | Ht 59.0 in | Wt 120.7 lb

## 2015-02-28 DIAGNOSIS — D471 Chronic myeloproliferative disease: Secondary | ICD-10-CM

## 2015-02-28 LAB — CBC WITH DIFFERENTIAL/PLATELET
BASO%: 1.2 % (ref 0.0–2.0)
Basophils Absolute: 0.2 10*3/uL — ABNORMAL HIGH (ref 0.0–0.1)
EOS%: 3.6 % (ref 0.0–7.0)
Eosinophils Absolute: 0.7 10*3/uL — ABNORMAL HIGH (ref 0.0–0.5)
HCT: 38.1 % (ref 34.8–46.6)
HGB: 12.4 g/dL (ref 11.6–15.9)
LYMPH%: 14.1 % (ref 14.0–49.7)
MCH: 26.5 pg (ref 25.1–34.0)
MCHC: 32.5 g/dL (ref 31.5–36.0)
MCV: 81.5 fL (ref 79.5–101.0)
MONO#: 1 10*3/uL — ABNORMAL HIGH (ref 0.1–0.9)
MONO%: 5.2 % (ref 0.0–14.0)
NEUT#: 14.4 10*3/uL — ABNORMAL HIGH (ref 1.5–6.5)
NEUT%: 75.9 % (ref 38.4–76.8)
PLATELETS: 486 10*3/uL — AB (ref 145–400)
RBC: 4.68 10*6/uL (ref 3.70–5.45)
RDW: 16.5 % — ABNORMAL HIGH (ref 11.2–14.5)
WBC: 18.9 10*3/uL — AB (ref 3.9–10.3)
lymph#: 2.7 10*3/uL (ref 0.9–3.3)

## 2015-02-28 LAB — COMPREHENSIVE METABOLIC PANEL (CC13)
ALBUMIN: 3.5 g/dL (ref 3.5–5.0)
ALT: 10 U/L (ref 0–55)
AST: 16 U/L (ref 5–34)
Alkaline Phosphatase: 56 U/L (ref 40–150)
Anion Gap: 9 mEq/L (ref 3–11)
BUN: 25.4 mg/dL (ref 7.0–26.0)
CO2: 26 meq/L (ref 22–29)
Calcium: 10 mg/dL (ref 8.4–10.4)
Chloride: 107 mEq/L (ref 98–109)
Creatinine: 1.2 mg/dL — ABNORMAL HIGH (ref 0.6–1.1)
EGFR: 44 mL/min/{1.73_m2} — ABNORMAL LOW (ref >90)
GLUCOSE: 113 mg/dL (ref 70–140)
POTASSIUM: 4.2 meq/L (ref 3.5–5.1)
SODIUM: 142 meq/L (ref 136–145)
TOTAL PROTEIN: 6.7 g/dL (ref 6.4–8.3)
Total Bilirubin: 0.36 mg/dL (ref 0.20–1.20)

## 2015-02-28 NOTE — Telephone Encounter (Signed)
per pof to sch pt appt-gave pt copy of sch °

## 2015-02-28 NOTE — Progress Notes (Signed)
Hematology and Oncology Follow Up Visit  Donna Webb [id number] 1941-10-03 74 y.o. 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 She was treated with hydrea till 08/2012 and switch to Anagrelide due to lack of response.   Current therapy: Anagrelide 0.5 mg BID started in 08/2012.  Interim History: [name] presents here for a follow up visit with her sister. Since the last visit, she reports no complaints. She continues to tolerate anagrelide without any complications. She has not reported any gastrointestinal issues. She has not reported any bleeding or thrombosis. She has not reported any hospitalizations or illnesses.  She reports no fevers, chills or night sweats. She has not reported any chest pain or difficulty breathing. She reports no GI symptoms No nausea, or emesis issues. No bleeding or bruising symptoms. She has not reported any lower extremity edema or any other complications. She's continued to have a reasonable quality of life she has not reported any urinary symptoms. Her appetite has been excellent and blood pressure has been under control. Rest of her review of systems unremarkable.  Medications: I have reviewed the patient's current medications.  Current Outpatient Prescriptions  Medication Sig Dispense Refill  . anagrelide (AGRYLIN) 0.5 MG capsule TAKE 1 CAPSULE (0.5 MG TOTAL) BY MOUTH 2 (TWO) TIMES DAILY. 60 capsule 3  . anagrelide (AGRYLIN) 0.5 MG capsule TAKE 1 CAPSULE (0.5 MG TOTAL) BY MOUTH 2 (TWO) TIMES DAILY. 60 capsule 3  . anagrelide (AGRYLIN) 0.5 MG capsule TAKE 1 CAPSULE (0.5 MG TOTAL) BY MOUTH 2 (TWO) TIMES DAILY. 60 capsule 3  . anagrelide (AGRYLIN) 0.5 MG capsule TAKE 1 CAPSULE BY MOUTH TWICE DAILY 60 capsule 3  . aspirin 81 MG tablet Take 81 mg by mouth daily.    Marland Kitchen atenolol (TENORMIN) 50 MG tablet TAKE 1 TABLET (50 MG TOTAL) BY MOUTH 2  (TWO) TIMES DAILY. 180 tablet 3  . Calcium Carbonate-Vitamin D (CALCIUM-VITAMIN D) 500-200 MG-UNIT per tablet Take 1 tablet by mouth 2 (two) times daily with meals.      . cloNIDine (CATAPRES) 0.2 MG tablet TAKE 1 TABLET (0.2 MG TOTAL) BY MOUTH 2 (TWO) TIMES DAILY. 180 tablet 3  . donepezil (ARICEPT) 10 MG tablet TAKE 1 TABLET (10 MG TOTAL) BY MOUTH AT BEDTIME. 90 tablet 0  . lisinopril (PRINIVIL,ZESTRIL) 20 MG tablet TAKE 1 TABLET (20 MG TOTAL) BY MOUTH DAILY. 90 tablet 3  . memantine (NAMENDA) 10 MG tablet TAKE 1 TABLET (10 MG TOTAL) BY MOUTH 2 (TWO) TIMES DAILY. 180 tablet 1  . Multiple Vitamin (MULTIVITAMIN) tablet Take 1 tablet by mouth daily.       No current facility-administered medications for this visit.     Allergies:  Allergies  Allergen Reactions  . Amlodipine Besylate     REACTION: intolerance  . Neosporin [Neomycin-Bacitracin Zn-Polymyx] Rash    Rash     Past Medical History, Surgical history, Social history, and Family History were reviewed and updated.    Physical Exam: Blood pressure 149/89, pulse 77, temperature 97.7 F (36.5 C), temperature source Oral, resp. rate 16, height 4\' 11"  (1.499 m), weight 120 lb 11.2 oz (54.749 kg), SpO2 96 %. ECOG: 2 General appearance: alert and cooperative.  Head: Normocephalic, without obvious abnormality Neck: no adenopathy or thyromegaly. Lymph nodes: Cervical, supraclavicular, and axillary nodes normal. Heart:regular rate and rhythm, S1, S2 normal, no murmur, click, rub or gallop Lung:chest clear, no wheezing, rales, normal  symmetric air entry. Abdomen: soft, non-tender, without masses or organomegaly EXT:no erythema, induration, or nodules Skin showed no rashes or lesions.  Lab Results: Lab Results  Component Value Date   WBC 18.9* 02/28/2015   HGB 12.4 02/28/2015   HCT 38.1 02/28/2015   MCV 81.5 02/28/2015   PLT 486* 02/28/2015     Chemistry      Component Value Date/Time   NA 143 11/29/2014 0832   NA 139  12/16/2011 1013   K 4.3 11/29/2014 0832   K 4.7 12/16/2011 1013   CL 101 12/16/2011 1013   CO2 25 11/29/2014 0832   CO2 25 12/16/2011 1013   BUN 16.9 11/29/2014 0832   BUN 14 12/16/2011 1013   CREATININE 1.1 11/29/2014 0832   CREATININE 0.89 12/16/2011 1013      Component Value Date/Time   CALCIUM 9.3 11/29/2014 0832   CALCIUM 11.4* 12/16/2011 1013   ALKPHOS 58 11/29/2014 0832   ALKPHOS 63 12/16/2011 1013   AST 17 11/29/2014 0832   AST 45* 12/16/2011 1013   ALT 7 11/29/2014 0832   ALT 36* 12/16/2011 1013   BILITOT 0.42 11/29/2014 0832   BILITOT 0.6 12/16/2011 1013      Impression and Plan:  Patient is a 74 year old woman with the following issues:  1. Myeloproliferative disorder currently on Anagrelide 0.5 mg per day. Her platelet count today is 486 and continue to be relatively stable. I plan on keeping her on the same dose and schedule as long as her platelet count less than 800,000.  2. Hypertension managed by Dr. Alysia Penna. Her blood pressure seems to be reasonably controlled.  3. Dementia: She is currently on Namenda and have improved her symptoms considerably.  4. Followup: Will be in 3 months.     SHADAD,FIRAS 5/4/20168:37 AM

## 2015-03-13 ENCOUNTER — Other Ambulatory Visit: Payer: Self-pay | Admitting: Family Medicine

## 2015-03-26 ENCOUNTER — Other Ambulatory Visit: Payer: Self-pay | Admitting: Family Medicine

## 2015-05-01 ENCOUNTER — Other Ambulatory Visit: Payer: Self-pay | Admitting: Family Medicine

## 2015-05-02 NOTE — Telephone Encounter (Signed)
Okay per Dr. Sarajane Jews to refill and I did send script e-scribe.

## 2015-05-17 ENCOUNTER — Other Ambulatory Visit: Payer: Self-pay | Admitting: Oncology

## 2015-05-31 ENCOUNTER — Other Ambulatory Visit (HOSPITAL_BASED_OUTPATIENT_CLINIC_OR_DEPARTMENT_OTHER): Payer: Medicare PPO

## 2015-05-31 ENCOUNTER — Telehealth: Payer: Self-pay | Admitting: Oncology

## 2015-05-31 ENCOUNTER — Ambulatory Visit (HOSPITAL_BASED_OUTPATIENT_CLINIC_OR_DEPARTMENT_OTHER): Payer: Medicare PPO | Admitting: Oncology

## 2015-05-31 VITALS — BP 169/81 | HR 70 | Temp 98.1°F | Resp 18 | Ht 59.0 in | Wt 115.9 lb

## 2015-05-31 DIAGNOSIS — D471 Chronic myeloproliferative disease: Secondary | ICD-10-CM | POA: Diagnosis not present

## 2015-05-31 LAB — CBC WITH DIFFERENTIAL/PLATELET
BASO%: 1.3 % (ref 0.0–2.0)
Basophils Absolute: 0.3 10*3/uL — ABNORMAL HIGH (ref 0.0–0.1)
EOS%: 3.1 % (ref 0.0–7.0)
Eosinophils Absolute: 0.7 10*3/uL — ABNORMAL HIGH (ref 0.0–0.5)
HCT: 40.7 % (ref 34.8–46.6)
HGB: 13.1 g/dL (ref 11.6–15.9)
LYMPH%: 11.8 % — AB (ref 14.0–49.7)
MCH: 26 pg (ref 25.1–34.0)
MCHC: 32.2 g/dL (ref 31.5–36.0)
MCV: 80.7 fL (ref 79.5–101.0)
MONO#: 1.1 10*3/uL — ABNORMAL HIGH (ref 0.1–0.9)
MONO%: 4.8 % (ref 0.0–14.0)
NEUT%: 79 % — AB (ref 38.4–76.8)
NEUTROS ABS: 17.5 10*3/uL — AB (ref 1.5–6.5)
Platelets: 543 10*3/uL — ABNORMAL HIGH (ref 145–400)
RBC: 5.05 10*6/uL (ref 3.70–5.45)
RDW: 17.1 % — ABNORMAL HIGH (ref 11.2–14.5)
WBC: 22.2 10*3/uL — ABNORMAL HIGH (ref 3.9–10.3)
lymph#: 2.6 10*3/uL (ref 0.9–3.3)

## 2015-05-31 LAB — COMPREHENSIVE METABOLIC PANEL (CC13)
ALT: 12 U/L (ref 0–55)
AST: 17 U/L (ref 5–34)
Albumin: 3.7 g/dL (ref 3.5–5.0)
Alkaline Phosphatase: 53 U/L (ref 40–150)
Anion Gap: 6 mEq/L (ref 3–11)
BUN: 19.5 mg/dL (ref 7.0–26.0)
CALCIUM: 9.7 mg/dL (ref 8.4–10.4)
CHLORIDE: 107 meq/L (ref 98–109)
CO2: 28 mEq/L (ref 22–29)
Creatinine: 1.2 mg/dL — ABNORMAL HIGH (ref 0.6–1.1)
EGFR: 47 mL/min/{1.73_m2} — AB (ref >90)
Glucose: 110 mg/dl (ref 70–140)
POTASSIUM: 4.2 meq/L (ref 3.5–5.1)
Sodium: 140 mEq/L (ref 136–145)
Total Bilirubin: 0.43 mg/dL (ref 0.20–1.20)
Total Protein: 6.7 g/dL (ref 6.4–8.3)

## 2015-05-31 NOTE — Telephone Encounter (Signed)
Pt confirmed labs/ov per 08/04 POF, gave pt avs and calendar.... KJ °

## 2015-05-31 NOTE — Addendum Note (Signed)
Addended by: Amelia Jo I on: 05/31/2015 10:20 AM   Modules accepted: Medications

## 2015-05-31 NOTE — Progress Notes (Signed)
Hematology and Oncology Follow Up Visit  Donna Webb [id number] 06/28/1941 74 y.o. 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 She was treated with hydrea till 08/2012 and switch to Anagrelide due to lack of response.   Current therapy: Anagrelide 0.5 mg BID started in 08/2012.  Interim History: Donna Webb presents here for a follow up visit with her sister. Since the last visit, she continues to do well and reports no complaints. She continues to tolerate anagrelide without any complications. She has not reported any bleeding or thrombosis. She has not reported any hospitalizations or illnesses. She continues to ambulate without any difficulties and have not had any falls or syncope. She does have issues related to memory and behavioral issue according to her sister. She reports no fevers, chills or night sweats. She has not reported any chest pain or difficulty breathing. She reports no GI symptoms No nausea, or emesis issues. No bleeding or bruising symptoms. She has not reported any lower extremity edema or any other complications. She's continued to have a reasonable quality of life she has not reported any urinary symptoms. Her appetite has been excellent and blood pressure has been under control. Rest of her review of systems unremarkable.  Medications: I have reviewed the patient's current medications.  Current Outpatient Prescriptions  Medication Sig Dispense Refill  . anagrelide (AGRYLIN) 0.5 MG capsule TAKE 1 CAPSULE (0.5 MG TOTAL) BY MOUTH 2 (TWO) TIMES DAILY. 60 capsule 3  . anagrelide (AGRYLIN) 0.5 MG capsule TAKE 1 CAPSULE (0.5 MG TOTAL) BY MOUTH 2 (TWO) TIMES DAILY. 60 capsule 3  . anagrelide (AGRYLIN) 0.5 MG capsule TAKE 1 CAPSULE (0.5 MG TOTAL) BY MOUTH 2 (TWO) TIMES DAILY. 60 capsule 3  . anagrelide (AGRYLIN) 0.5 MG capsule TAKE 1 CAPSULE BY MOUTH TWICE  DAILY 60 capsule 3  . aspirin 81 MG tablet Take 81 mg by mouth daily.    Marland Kitchen atenolol (TENORMIN) 50 MG tablet TAKE 1 TABLET (50 MG TOTAL) BY MOUTH 2 (TWO) TIMES DAILY. 180 tablet 3  . Calcium Carbonate-Vitamin D (CALCIUM-VITAMIN D) 500-200 MG-UNIT per tablet Take 1 tablet by mouth 2 (two) times daily with meals.      . cloNIDine (CATAPRES) 0.2 MG tablet TAKE 1 TABLET (0.2 MG TOTAL) BY MOUTH 2 (TWO) TIMES DAILY. 180 tablet 3  . donepezil (ARICEPT) 10 MG tablet TAKE 1 TABLET BY MOUTH AT BEDTIME. 90 tablet 1  . lisinopril (PRINIVIL,ZESTRIL) 20 MG tablet TAKE 1 TABLET (20 MG TOTAL) BY MOUTH DAILY. 90 tablet 2  . memantine (NAMENDA) 10 MG tablet TAKE 1 TABLET (10 MG TOTAL) BY MOUTH 2 (TWO) TIMES DAILY. 180 tablet 1  . Multiple Vitamin (MULTIVITAMIN) tablet Take 1 tablet by mouth daily.       No current facility-administered medications for this visit.     Allergies:  Allergies  Allergen Reactions  . Amlodipine Besylate     REACTION: intolerance  . Neosporin [Neomycin-Bacitracin Zn-Polymyx] Rash    Rash     Past Medical History, Surgical history, Social history, and Family History were reviewed and updated.    Physical Exam: Blood pressure 169/81, pulse 70, temperature 98.1 F (36.7 C), temperature source Oral, resp. rate 18, height 4\' 11"  (1.499 m), weight 115 lb 14.4 oz (52.572 kg), SpO2 94 %. ECOG: 2 General appearance: alert and cooperative. Not in any distress. Head: Normocephalic, without obvious abnormality Neck: no adenopathy or thyromegaly. Lymph  nodes: Cervical, supraclavicular, and axillary nodes normal. Heart:regular rate and rhythm, S1, S2 normal, no murmur, click, rub or gallop Lung:chest clear, no wheezing, rales, normal symmetric air entry. Abdomen: soft, non-tender, without masses or organomegaly EXT:no erythema, induration, or nodules Skin showed no rashes or lesions.  Lab Results: Lab Results  Component Value Date   WBC 22.2* 05/31/2015   HGB 13.1 05/31/2015    HCT 40.7 05/31/2015   MCV 80.7 05/31/2015   PLT 543* 05/31/2015     Chemistry      Component Value Date/Time   NA 142 02/28/2015 0812   NA 139 12/16/2011 1013   K 4.2 02/28/2015 0812   K 4.7 12/16/2011 1013   CL 101 12/16/2011 1013   CO2 26 02/28/2015 0812   CO2 25 12/16/2011 1013   BUN 25.4 02/28/2015 0812   BUN 14 12/16/2011 1013   CREATININE 1.2* 02/28/2015 0812   CREATININE 0.89 12/16/2011 1013      Component Value Date/Time   CALCIUM 10.0 02/28/2015 0812   CALCIUM 11.4* 12/16/2011 1013   ALKPHOS 56 02/28/2015 0812   ALKPHOS 63 12/16/2011 1013   AST 16 02/28/2015 0812   AST 45* 12/16/2011 1013   ALT 10 02/28/2015 0812   ALT 36* 12/16/2011 1013   BILITOT 0.36 02/28/2015 0812   BILITOT 0.6 12/16/2011 1013      Impression and Plan:  Patient is a 74 year old woman with the following issues:  1. Myeloproliferative disorder currently on Anagrelide 0.5 mg per day. Her platelet count today is slightly elevated to 543 by reasonably controlled. I plan on keeping her on the same dose and schedule as long as her platelet count less than 800,000.  2. Hypertension managed by Dr. Alysia Penna. Her blood pressure seems to be reasonably controlled.  3. Dementia: She is currently on Namenda but have more problems with behavioral issues. She will follow-up with her primary care physician regarding that.  4. Followup: Will be in 3 months.     SHADAD,FIRAS 8/4/201610:09 AM

## 2015-06-11 ENCOUNTER — Ambulatory Visit: Payer: Medicare PPO | Admitting: Family Medicine

## 2015-07-23 ENCOUNTER — Other Ambulatory Visit: Payer: Self-pay | Admitting: Family Medicine

## 2015-09-03 ENCOUNTER — Telehealth: Payer: Self-pay | Admitting: Family Medicine

## 2015-09-03 ENCOUNTER — Encounter: Payer: Self-pay | Admitting: Family Medicine

## 2015-09-03 ENCOUNTER — Other Ambulatory Visit (INDEPENDENT_AMBULATORY_CARE_PROVIDER_SITE_OTHER): Payer: Medicare PPO

## 2015-09-03 ENCOUNTER — Ambulatory Visit (INDEPENDENT_AMBULATORY_CARE_PROVIDER_SITE_OTHER): Payer: Medicare PPO | Admitting: Family Medicine

## 2015-09-03 VITALS — BP 120/94 | HR 73 | Temp 98.2°F | Ht 59.0 in | Wt 115.0 lb

## 2015-09-03 DIAGNOSIS — Z0000 Encounter for general adult medical examination without abnormal findings: Secondary | ICD-10-CM | POA: Diagnosis not present

## 2015-09-03 DIAGNOSIS — F0391 Unspecified dementia with behavioral disturbance: Secondary | ICD-10-CM

## 2015-09-03 DIAGNOSIS — I10 Essential (primary) hypertension: Secondary | ICD-10-CM | POA: Diagnosis not present

## 2015-09-03 DIAGNOSIS — Z23 Encounter for immunization: Secondary | ICD-10-CM | POA: Diagnosis not present

## 2015-09-03 DIAGNOSIS — E785 Hyperlipidemia, unspecified: Secondary | ICD-10-CM

## 2015-09-03 DIAGNOSIS — R300 Dysuria: Secondary | ICD-10-CM | POA: Diagnosis not present

## 2015-09-03 LAB — CBC WITH DIFFERENTIAL/PLATELET
BASOS ABS: 0.4 10*3/uL — AB (ref 0.0–0.1)
BASOS PCT: 1.6 % (ref 0.0–3.0)
EOS ABS: 0.8 10*3/uL — AB (ref 0.0–0.7)
Eosinophils Relative: 3 % (ref 0.0–5.0)
HCT: 42.3 % (ref 36.0–46.0)
Hemoglobin: 13.4 g/dL (ref 12.0–15.0)
LYMPHS ABS: 3 10*3/uL (ref 0.7–4.0)
Lymphocytes Relative: 11.9 % — ABNORMAL LOW (ref 12.0–46.0)
MCHC: 31.8 g/dL (ref 30.0–36.0)
MCV: 81.1 fl (ref 78.0–100.0)
MONO ABS: 1.4 10*3/uL — AB (ref 0.1–1.0)
Monocytes Relative: 5.5 % (ref 3.0–12.0)
NEUTROS ABS: 19.5 10*3/uL — AB (ref 1.4–7.7)
NEUTROS PCT: 78 % — AB (ref 43.0–77.0)
RBC: 5.21 Mil/uL — ABNORMAL HIGH (ref 3.87–5.11)
RDW: 18.1 % — AB (ref 11.5–15.5)
WBC: 25 10*3/uL (ref 4.0–10.5)

## 2015-09-03 LAB — LIPID PANEL
CHOL/HDL RATIO: 5
Cholesterol: 187 mg/dL (ref 0–200)
HDL: 40.5 mg/dL (ref >39.00)
LDL Cholesterol: 119 mg/dL — ABNORMAL HIGH (ref 0–99)
NonHDL: 146.98
Triglycerides: 138 mg/dL (ref 0.0–149.0)
VLDL: 27.6 mg/dL (ref 0.0–40.0)

## 2015-09-03 LAB — POCT URINALYSIS DIPSTICK
BILIRUBIN UA: NEGATIVE
Glucose, UA: NEGATIVE
KETONES UA: NEGATIVE
Nitrite, UA: NEGATIVE
PH UA: 7
RBC UA: NEGATIVE
Spec Grav, UA: 1.015
Urobilinogen, UA: 0.2

## 2015-09-03 LAB — BASIC METABOLIC PANEL
BUN: 16 mg/dL (ref 6–23)
CALCIUM: 10.6 mg/dL — AB (ref 8.4–10.5)
CO2: 29 meq/L (ref 19–32)
Chloride: 105 mEq/L (ref 96–112)
Creatinine, Ser: 0.99 mg/dL (ref 0.40–1.20)
GFR: 58.17 mL/min — AB (ref >60.00)
GLUCOSE: 94 mg/dL (ref 70–99)
POTASSIUM: 3.8 meq/L (ref 3.5–5.1)
SODIUM: 141 meq/L (ref 135–145)

## 2015-09-03 LAB — HEPATIC FUNCTION PANEL
ALBUMIN: 4 g/dL (ref 3.5–5.2)
ALK PHOS: 57 U/L (ref 39–117)
ALT: 9 U/L (ref 0–35)
AST: 16 U/L (ref 0–37)
Bilirubin, Direct: 0.1 mg/dL (ref 0.0–0.3)
TOTAL PROTEIN: 6.8 g/dL (ref 6.0–8.3)
Total Bilirubin: 0.4 mg/dL (ref 0.2–1.2)

## 2015-09-03 LAB — TSH: TSH: 1.4 u[IU]/mL (ref 0.35–4.50)

## 2015-09-03 MED ORDER — MEMANTINE HCL 10 MG PO TABS: ORAL_TABLET | ORAL | Status: AC

## 2015-09-03 MED ORDER — CIPROFLOXACIN HCL 500 MG PO TABS: 500.0000 mg | ORAL_TABLET | Freq: Two times a day (BID) | ORAL | Status: AC

## 2015-09-03 MED ORDER — DONEPEZIL HCL 10 MG PO TABS: 10.0000 mg | ORAL_TABLET | Freq: Every day | ORAL | Status: AC

## 2015-09-03 MED ORDER — ATENOLOL 50 MG PO TABS: ORAL_TABLET | ORAL | Status: AC

## 2015-09-03 MED ORDER — LORAZEPAM 0.5 MG PO TABS: 0.5000 mg | ORAL_TABLET | Freq: Three times a day (TID) | ORAL | Status: AC | PRN

## 2015-09-03 MED ORDER — OXYBUTYNIN CHLORIDE 5 MG PO TABS: 5.0000 mg | ORAL_TABLET | Freq: Two times a day (BID) | ORAL | Status: AC

## 2015-09-03 MED ORDER — LISINOPRIL 20 MG PO TABS: ORAL_TABLET | ORAL | Status: AC

## 2015-09-03 NOTE — Progress Notes (Signed)
Pre visit review using our clinic review tool, if applicable. No additional management support is needed unless otherwise documented below in the visit note. 

## 2015-09-03 NOTE — Progress Notes (Signed)
   Subjective:    Patient ID: Donna Webb, female    DOB: 1941-07-02, 74 y.o.   MRN: [id number]  HPI 74 yr old female for a cpx. She is here with her sister. She has been doing well from a physcial standpoint, but her dementia has been worsening. There are times when she gets very anxious and can be angry and combative. She has also developed a problem with urinary incontinence. No burning or urgency. Her BM are normal and continent.    Review of Systems  Constitutional: Negative.  Negative for fever, diaphoresis, activity change, appetite change, fatigue and unexpected weight change.  HENT: Negative.  Negative for congestion, ear pain, hearing loss, nosebleeds, sore throat, tinnitus, trouble swallowing and voice change.   Eyes: Negative.  Negative for photophobia, pain, discharge, redness and visual disturbance.  Respiratory: Negative.  Negative for apnea, cough, choking, chest tightness, shortness of breath, wheezing and stridor.   Cardiovascular: Negative.  Negative for chest pain, palpitations and leg swelling.  Gastrointestinal: Negative.  Negative for nausea, vomiting, abdominal pain, diarrhea, constipation, blood in stool, abdominal distention and rectal pain.  Genitourinary: Negative.  Negative for dysuria, urgency, frequency, hematuria, flank pain, vaginal bleeding, vaginal discharge, enuresis, difficulty urinating, vaginal pain and menstrual problem.  Musculoskeletal: Negative.  Negative for myalgias, back pain, joint swelling, arthralgias, gait problem, neck pain and neck stiffness.  Skin: Negative.  Negative for color change, pallor, rash and wound.  Neurological: Negative.  Negative for dizziness, tremors, seizures, syncope, speech difficulty, weakness, light-headedness, numbness and headaches.  Hematological: Negative for adenopathy. Does not bruise/bleed easily.  Psychiatric/Behavioral: Negative.  Negative for hallucinations, behavioral problems, confusion, sleep disturbance,  dysphoric mood and agitation. The patient is not nervous/anxious.        Objective:   Physical Exam  Constitutional: She is oriented to person, place, and time. She appears well-developed and well-nourished. No distress.  HENT:  Head: Normocephalic and atraumatic.  Right Ear: External ear normal.  Left Ear: External ear normal.  Nose: Nose normal.  Mouth/Throat: Oropharynx is clear and moist. No oropharyngeal exudate.  Eyes: Conjunctivae and EOM are normal. Pupils are equal, round, and reactive to light. No scleral icterus.  Neck: Normal range of motion. Neck supple. No JVD present. No thyromegaly present.  Cardiovascular: Normal rate, regular rhythm, normal heart sounds and intact distal pulses.  Exam reveals no gallop and no friction rub.   No murmur heard. EKG normal   Pulmonary/Chest: Effort normal and breath sounds normal. No respiratory distress. She has no wheezes. She has no rales. She exhibits no tenderness.  Abdominal: Soft. Bowel sounds are normal. She exhibits no distension and no mass. There is no tenderness. There is no rebound and no guarding.  Musculoskeletal: Normal range of motion. She exhibits no edema or tenderness.  Lymphadenopathy:    She has no cervical adenopathy.  Neurological: She is alert and oriented to person, place, and time. She has normal reflexes. No cranial nerve deficit. She exhibits normal muscle tone. Coordination normal.  Skin: Skin is warm and dry. No rash noted. No erythema.  Psychiatric: She has a normal mood and affect. Her behavior is normal. Judgment and thought content normal.          Assessment & Plan:  Well exam. We discussd diet and exercise. Try Lorazepam for the anxiety, and try Oxybutynin for the incontinence.

## 2015-09-03 NOTE — Telephone Encounter (Signed)
Please see phone note, pt was notified.

## 2015-09-03 NOTE — Telephone Encounter (Signed)
Donna Webb from [facility] called, pt's WBC is 25 and I did give this information to Dr. Sarajane Jews for review.

## 2015-09-04 NOTE — Addendum Note (Signed)
Addended by: Aggie Hacker A on: 09/04/2015 12:05 PM   Modules accepted: Orders, Medications

## 2015-09-05 ENCOUNTER — Telehealth: Payer: Self-pay | Admitting: Oncology

## 2015-09-05 ENCOUNTER — Other Ambulatory Visit (HOSPITAL_BASED_OUTPATIENT_CLINIC_OR_DEPARTMENT_OTHER): Payer: Medicare PPO

## 2015-09-05 ENCOUNTER — Ambulatory Visit (HOSPITAL_BASED_OUTPATIENT_CLINIC_OR_DEPARTMENT_OTHER): Payer: Medicare PPO | Admitting: Oncology

## 2015-09-05 VITALS — BP 133/74 | HR 63 | Temp 98.3°F | Resp 18 | Ht 59.0 in | Wt 118.4 lb

## 2015-09-05 DIAGNOSIS — D471 Chronic myeloproliferative disease: Secondary | ICD-10-CM

## 2015-09-05 LAB — CBC WITH DIFFERENTIAL/PLATELET
BASO%: 0.6 % (ref 0.0–2.0)
Basophils Absolute: 0.1 10*3/uL (ref 0.0–0.1)
EOS ABS: 0.6 10*3/uL — AB (ref 0.0–0.5)
EOS%: 3.1 % (ref 0.0–7.0)
HEMATOCRIT: 38.1 % (ref 34.8–46.6)
HEMOGLOBIN: 12.4 g/dL (ref 11.6–15.9)
LYMPH#: 2.3 10*3/uL (ref 0.9–3.3)
LYMPH%: 11.2 % — ABNORMAL LOW (ref 14.0–49.7)
MCH: 26.1 pg (ref 25.1–34.0)
MCHC: 32.5 g/dL (ref 31.5–36.0)
MCV: 80.2 fL (ref 79.5–101.0)
MONO#: 1.3 10*3/uL — ABNORMAL HIGH (ref 0.1–0.9)
MONO%: 6.5 % (ref 0.0–14.0)
NEUT#: 16 10*3/uL — ABNORMAL HIGH (ref 1.5–6.5)
NEUT%: 78.6 % — ABNORMAL HIGH (ref 38.4–76.8)
Platelets: 548 10*3/uL — ABNORMAL HIGH (ref 145–400)
RBC: 4.75 10*6/uL (ref 3.70–5.45)
RDW: 16.7 % — AB (ref 11.2–14.5)
WBC: 20.3 10*3/uL — AB (ref 3.9–10.3)

## 2015-09-05 LAB — URINE CULTURE

## 2015-09-05 NOTE — Telephone Encounter (Signed)
per pof to sch pt appt-gave pt copy of avs °

## 2015-09-05 NOTE — Progress Notes (Signed)
Hematology and Oncology Follow Up Visit  Donna Webb [id number] 16-Jun-1941 74 y.o. 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 She was treated with hydrea till 08/2012 and switch to Anagrelide due to lack of response.   Current therapy: Anagrelide 0.5 mg BID started in 08/2012.  Interim History: Donna Webb presents here for a follow up visit with her sister. Since the last visit, she reports doing very well. Her behavioral issues have improved dramatically and no medical complaints. She was diagnosed with a urinary tract infection and currently on antibiotics.    She continues to tolerate anagrelide without any complications. She has not reported any bleeding or thrombosis. She has not reported any hospitalizations. She continues to ambulate without any difficulties and have not had any falls or syncope. Her appetite is excellent and have gained a few pounds since the last visit.  She reports no fevers, chills or night sweats. She has not reported any chest pain or difficulty breathing. She reports no GI symptoms No nausea, or emesis issues. No bleeding or bruising symptoms. She has not reported any lower extremity edema or any other complications. She's continued to have a reasonable quality of life she has not reported any urinary symptoms. Her appetite has been excellent and blood pressure has been under control. Rest of her review of systems unremarkable.  Medications: I have reviewed the patient's current medications.  Current Outpatient Prescriptions  Medication Sig Dispense Refill  . anagrelide (AGRYLIN) 0.5 MG capsule TAKE 1 CAPSULE (0.5 MG TOTAL) BY MOUTH 2 (TWO) TIMES DAILY. 60 capsule 3  . aspirin 81 MG tablet Take 81 mg by mouth daily.    Marland Kitchen atenolol (TENORMIN) 50 MG tablet TAKE 1 TABLET (50 MG TOTAL) BY MOUTH 2 (TWO) TIMES DAILY. 180 tablet 3  . Calcium  Carbonate-Vitamin D (CALCIUM-VITAMIN D) 500-200 MG-UNIT per tablet Take 1 tablet by mouth 2 (two) times daily with meals.      . ciprofloxacin (CIPRO) 500 MG tablet Take 1 tablet (500 mg total) by mouth 2 (two) times daily. 14 tablet 0  . cloNIDine (CATAPRES) 0.2 MG tablet TAKE 1 TABLET (0.2 MG TOTAL) BY MOUTH 2 (TWO) TIMES DAILY. 180 tablet 3  . donepezil (ARICEPT) 10 MG tablet Take 1 tablet (10 mg total) by mouth at bedtime. 90 tablet 3  . lisinopril (PRINIVIL,ZESTRIL) 20 MG tablet TAKE 1 TABLET (20 MG TOTAL) BY MOUTH DAILY. 90 tablet 3  . LORazepam (ATIVAN) 0.5 MG tablet Take 1 tablet (0.5 mg total) by mouth every 8 (eight) hours as needed for anxiety. 90 tablet 2  . memantine (NAMENDA) 10 MG tablet TAKE 1 TABLET (10 MG TOTAL) BY MOUTH 2 (TWO) TIMES DAILY. 180 tablet 3  . Multiple Vitamin (MULTIVITAMIN) tablet Take 1 tablet by mouth daily.      Marland Kitchen oxybutynin (DITROPAN) 5 MG tablet Take 1 tablet (5 mg total) by mouth 2 (two) times daily. 180 tablet 3   No current facility-administered medications for this visit.     Allergies:  Allergies  Allergen Reactions  . Amlodipine Besylate Other (See Comments)  . Neosporin [Neomycin-Bacitracin Zn-Polymyx] Rash    Rash     Past Medical History, Surgical history, Social history, and Family History were reviewed and updated.    Physical Exam: Blood pressure 133/74, pulse 63, temperature 98.3 F (36.8 C), temperature source Oral, resp. rate 18, height 4\' 11"  (1.499 m), weight 118 lb  6.4 oz (53.706 kg), SpO2 98 %. ECOG: 2 General appearance: alert and cooperative. Cooperative without any behavioral issues. Head: Normocephalic, without obvious abnormality Neck: no adenopathy or thyromegaly. Lymph nodes: Cervical, supraclavicular, and axillary nodes normal. Heart:regular rate and rhythm, S1, S2 normal, no murmur, click, rub or gallop Lung:chest clear, no wheezing, rales, normal symmetric air entry. Abdomen: soft, non-tender, without masses or  organomegaly no shifting dullness or ascites. EXT:no erythema, induration, or nodules Skin showed no rashes or lesions.  Lab Results: Lab Results  Component Value Date   WBC 20.3* 09/05/2015   HGB 12.4 09/05/2015   HCT 38.1 09/05/2015   MCV 80.2 09/05/2015   PLT 548* 09/05/2015     Chemistry      Component Value Date/Time   NA 141 09/03/2015 1000   NA 140 05/31/2015 0936   K 3.8 09/03/2015 1000   K 4.2 05/31/2015 0936   CL 105 09/03/2015 1000   CO2 29 09/03/2015 1000   CO2 28 05/31/2015 0936   BUN 16 09/03/2015 1000   BUN 19.5 05/31/2015 0936   CREATININE 0.99 09/03/2015 1000   CREATININE 1.2* 05/31/2015 0936      Component Value Date/Time   CALCIUM 10.6* 09/03/2015 1000   CALCIUM 9.7 05/31/2015 0936   ALKPHOS 57 09/03/2015 1000   ALKPHOS 53 05/31/2015 0936   AST 16 09/03/2015 1000   AST 17 05/31/2015 0936   ALT 9 09/03/2015 1000   ALT 12 05/31/2015 0936   BILITOT 0.4 09/03/2015 1000   BILITOT 0.43 05/31/2015 0936      Impression and Plan:  Patient is a 74 year old woman with the following issues:  1. Myeloproliferative disorder currently on Anagrelide 0.5 mg per day. Her platelet count today remains under control without any major fluctuations. I see no need to change her dose and schedule. If the platelets increase above 800 we will consider adjusting the dose.  2. Hypertension managed by Dr. Alysia Penna. Her blood pressure seems to be reasonably controlled.  3. Dementia: She is currently on Namenda and her behavioral issues have improved since last visit.  4. Followup: Will be in 3 months.     Donna Webb 11/9/201610:18 AM

## 2015-09-12 ENCOUNTER — Telehealth: Payer: Self-pay | Admitting: Family Medicine

## 2015-09-12 DIAGNOSIS — H35379 Puckering of macula, unspecified eye: Secondary | ICD-10-CM

## 2015-09-12 NOTE — Telephone Encounter (Signed)
Pt req a referral to see a ophthalmology Baldemar Lenis for diagnostic code [phone]

## 2015-09-13 NOTE — Telephone Encounter (Signed)
done

## 2015-09-17 NOTE — Telephone Encounter (Signed)
I left a message with below information.  

## 2015-09-21 ENCOUNTER — Other Ambulatory Visit: Payer: Self-pay | Admitting: Oncology

## 2015-09-24 ENCOUNTER — Encounter: Payer: Self-pay | Admitting: Family Medicine

## 2015-09-25 NOTE — Telephone Encounter (Signed)
I understand her situation, but she does not qualify for Hospice because she is not terminal in the next 6 months. We could ask for a home health social worker to come assess her and give advice though

## 2015-11-07 ENCOUNTER — Telehealth: Payer: Self-pay | Admitting: Family Medicine

## 2015-11-07 NOTE — Telephone Encounter (Signed)
Donna Webb called from [place], sister said that pt has lost 15 lbs in last 2 months, not eating, primal behavior. Care giver requesting hospice consult.

## 2015-11-07 NOTE — Telephone Encounter (Signed)
I spoke with Dewaine Oats and gave the verbal order.

## 2015-11-07 NOTE — Telephone Encounter (Signed)
Please initiate a Hospice consult

## 2015-11-08 ENCOUNTER — Telehealth: Payer: Self-pay | Admitting: Family Medicine

## 2015-11-08 NOTE — Telephone Encounter (Signed)
The patient's family is requesting a DNR (DONT NOT RESUSCITATE) and they want to know if it is ok for their physician to sign it

## 2015-11-08 NOTE — Telephone Encounter (Signed)
Per Dr. Sarajane Jews, yes he will sign. I called and gave Pam this information.

## 2015-11-13 ENCOUNTER — Telehealth: Payer: Self-pay | Admitting: Family Medicine

## 2015-11-13 NOTE — Telephone Encounter (Signed)
Donna Webb w/Hospice wanted the doctor to know that the patient expired on 11/02/2015 at 1:50am.

## 2015-11-13 NOTE — Telephone Encounter (Signed)
Dr. Fry is aware.  

## 2015-11-26 ENCOUNTER — Telehealth: Payer: Self-pay | Admitting: Oncology

## 2015-11-26 NOTE — Telephone Encounter (Signed)
Pt's daughter called to cancel apt states pt has passed away... KJ

## 2015-11-28 DEATH — deceased

## 2015-12-05 ENCOUNTER — Other Ambulatory Visit: Payer: Medicare PPO

## 2015-12-05 ENCOUNTER — Ambulatory Visit: Payer: Medicare PPO | Admitting: Oncology

## 2017-07-16 ENCOUNTER — Encounter: Payer: Self-pay | Admitting: Family Medicine
